# Patient Record
Sex: Female | Born: 1995 | Hispanic: No | Marital: Single | Smoking: Never smoker
Health system: Southern US, Community
[De-identification: ages and names within clinical notes are randomized; demographics above are authoritative.]

## PROBLEM LIST (undated history)

## (undated) DIAGNOSIS — G43909 Migraine, unspecified, not intractable, without status migrainosus: Secondary | ICD-10-CM

## (undated) DIAGNOSIS — I493 Ventricular premature depolarization: Secondary | ICD-10-CM

## (undated) DIAGNOSIS — G40419 Other generalized epilepsy and epileptic syndromes, intractable, without status epilepticus: Secondary | ICD-10-CM

## (undated) HISTORY — PX: TONSILLECTOMY: SUR1361

## (undated) HISTORY — PX: ADENOIDECTOMY: SUR15

## (undated) HISTORY — PX: OTHER SURGICAL HISTORY: SHX169

## (undated) HISTORY — DX: Other generalized epilepsy and epileptic syndromes, intractable, without status epilepticus: G40.419

## (undated) HISTORY — PX: WISDOM TOOTH EXTRACTION: SHX21

---
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
## 2017-01-11 ENCOUNTER — Telehealth: Payer: Self-pay | Admitting: Family Medicine

## 2017-01-11 NOTE — Telephone Encounter (Signed)
Pt need refill in birth control. Pt has appt jan 28. It will run out before then. [facility] [address]

## 2017-01-12 MED ORDER — NORETHINDRONE 0.35 MG PO TABS: 1.0000 | ORAL_TABLET | Freq: Every day | ORAL | 11 refills | Status: DC

## 2017-01-12 NOTE — Telephone Encounter (Signed)
Done

## 2017-01-21 ENCOUNTER — Emergency Department (HOSPITAL_COMMUNITY)
Admission: EM | Admit: 2017-01-21 | Discharge: 2017-01-21 | Disposition: A | Discharge status: Home / Self Care | Payer: Medicaid Other | Attending: Emergency Medicine | Admitting: Emergency Medicine

## 2017-01-21 ENCOUNTER — Encounter (HOSPITAL_COMMUNITY): Payer: Self-pay | Admitting: Nurse Practitioner

## 2017-01-21 DIAGNOSIS — B9689 Other specified bacterial agents as the cause of diseases classified elsewhere: Secondary | ICD-10-CM

## 2017-01-21 DIAGNOSIS — N760 Acute vaginitis: Secondary | ICD-10-CM | POA: Insufficient documentation

## 2017-01-21 DIAGNOSIS — N939 Abnormal uterine and vaginal bleeding, unspecified: Secondary | ICD-10-CM

## 2017-01-21 DIAGNOSIS — N898 Other specified noninflammatory disorders of vagina: Secondary | ICD-10-CM | POA: Diagnosis present

## 2017-01-21 DIAGNOSIS — Z79899 Other long term (current) drug therapy: Secondary | ICD-10-CM | POA: Diagnosis not present

## 2017-01-21 LAB — URINALYSIS, ROUTINE W REFLEX MICROSCOPIC
BILIRUBIN URINE: NEGATIVE
Glucose, UA: NEGATIVE mg/dL
HGB URINE DIPSTICK: NEGATIVE
Ketones, ur: NEGATIVE mg/dL
Leukocytes, UA: NEGATIVE
Nitrite: NEGATIVE
PROTEIN: NEGATIVE mg/dL
Specific Gravity, Urine: 1.021 (ref 1.005–1.030)
pH: 7 (ref 5.0–8.0)

## 2017-01-21 LAB — WET PREP, GENITAL
Sperm: NONE SEEN
TRICH WET PREP: NONE SEEN
Yeast Wet Prep HPF POC: NONE SEEN

## 2017-01-21 LAB — POC URINE PREG, ED: PREG TEST UR: NEGATIVE

## 2017-01-21 MED ORDER — METRONIDAZOLE 500 MG PO TABS: 500.0000 mg | ORAL_TABLET | Freq: Two times a day (BID) | ORAL | 0 refills | Status: DC

## 2017-01-21 NOTE — ED Provider Notes (Signed)
[place] DEPT Provider Note   CSN: [id number] Arrival date & time: 01/21/17  1539     History   Chief Complaint Chief Complaint  Patient presents with  . Vaginal Discharge    HPI Hannah Page is a 21 y.o. female who presents to the ED with vaginal discharge. Patient has been on OC's for 2 years and has not had a period until the past 2 months. Patient reports period is not due until Jan 6th but she has noted spotting. Patient describes the discharge as pink and states it may be related to her period; however, sh reports recent unprotected sex and request evaluation for STD and pregnancy test.   HPI  Past Medical History:  Diagnosis Date  . Migraine   . PVCs (premature ventricular contractions)   . 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 Contraception 02/27/2015  . Right knee pain 01/29/2013  . Ankle pain 02/01/2012  . Tinea versicolor 09/29/2010  . Eczema 04/04/2010  . Acne 04/01/2010  . TENDINITIS, PATELLAR 02/17/2010  . Migraine headache 12/02/2008    Past Surgical History:  Procedure Laterality Date  . ADENOIDECTOMY    . TONSILLECTOMY    . wisdom teeth      OB History    No data available       Home Medications    Prior to Admission medications   Medication Sig Start Date End Date Taking? Authorizing Provider  acetaminophen (TYLENOL) 500 MG tablet Take by mouth.    [provider]  clindamycin-benzoyl peroxide (BENZACLIN WITH PUMP) gel APPLY TOPICALLY 2 (TWO) TIMES DAILY. 07/25/16   Alveda Reasons, MD  famotidine (PEPCID) 20 MG tablet Take 1 tablet (20 mg total) by mouth 2 (two) times daily. 11/11/14   Charlann Lange, PA-C  fluticasone (FLONASE) 50 MCG/ACT nasal spray Place 1 spray into both nostrils daily. 11/08/14   Charlann Lange, PA-C  ibuprofen (ADVIL,MOTRIN) 600 MG tablet Take 1 tablet (600 mg total) by mouth every 8 (eight)  hours as needed. 07/25/16   Alveda Reasons, MD  meloxicam (MOBIC) 15 MG tablet Take one tablet daily for 10 days, then take as needed.  Take with food 02/02/16   Thurman Coyer, DO  norethindrone (MICRONOR,CAMILA,ERRIN) 0.35 MG tablet Take 1 tablet (0.35 mg total) by mouth daily. 01/12/17   Alveda Reasons, MD  promethazine (PHENERGAN) 12.5 MG tablet Take 1 tablet (12.5 mg total) by mouth every 8 (eight) hours as needed for nausea or vomiting. 07/25/16   Alveda Reasons, MD    Family History Family History  Problem Relation Age of Onset  . Arthritis Mother     Social History Social History   Tobacco Use  . Smoking status: Never Smoker  . Smokeless tobacco: Never Used  Substance Use Topics  . Alcohol use: No  . Drug use: No     Allergies   Phenergan [promethazine hcl] and Prednisone   Review of Systems Review of Systems  Constitutional: Negative for chills and fever.  HENT: Negative.   Respiratory: Negative for cough and shortness of breath.   Cardiovascular: Negative for chest pain.  Gastrointestinal: Negative for abdominal pain, nausea and vomiting.  Genitourinary: Positive for vaginal bleeding and vaginal discharge. Negative for dysuria and frequency.  Musculoskeletal: Negative for back pain.  Skin: Negative for rash.  Neurological: Negative for syncope and headaches.  Psychiatric/Behavioral:  Negative for confusion.     Physical Exam Updated Vital Signs BP 139/73 (BP Location: Left Arm)   Pulse 92   Temp 98.5 F (36.9 C) (Oral)   Resp 18   SpO2 95%   Physical Exam  Constitutional: She is oriented to person, place, and time. She appears well-developed and well-nourished. No distress.  HENT:  Head: Normocephalic and atraumatic.  Eyes: EOM are normal.  Neck: Neck supple.  Cardiovascular: Normal rate.  Pulmonary/Chest: Effort normal.  Abdominal: Soft. There is no tenderness.  Genitourinary:  Genitourinary Comments: External genitalia without lesions,  frothy vaginal discharge vaginal vault. No CMT, no adnexal tenderness, uterus is not enlarged.   Musculoskeletal: Normal range of motion.  Neurological: She is alert and oriented to person, place, and time. No cranial nerve deficit.  Skin: Skin is warm and dry.  Psychiatric: She has a normal mood and affect.  Nursing note and vitals reviewed.    ED Treatments / Results  Labs (all labs ordered are listed, but only abnormal results are displayed) Labs Reviewed  WET PREP, GENITAL  URINALYSIS, ROUTINE W REFLEX MICROSCOPIC  RPR  HIV ANTIBODY (ROUTINE TESTING)  POC URINE PREG, ED  GC/CHLAMYDIA PROBE AMP () NOT AT [facility]    Radiology No results found.  Procedures Procedures (including critical care time)  Medications Ordered in ED Medications - No data to display   Initial Impression / Assessment and Plan / ED Course  I have reviewed the triage vital signs and the nursing notes. 21 y.o. female with vaginal d/c and spotting after unprotected sex. Patient awaiting results. Care turned over to [facility]. [facility] @ 5:08 pm.   Final Clinical Impressions(s) / ED Diagnoses   ED Discharge Orders    None       Debroah Baller [place], NP 01/21/17 1709    Dorie Rank, MD 01/21/17 [phone]

## 2017-01-21 NOTE — ED Triage Notes (Signed)
Pt is c/o a pink colored vaginal discharge that she states she suspects that might be related to a menstrual period. She however adds that she recently had period but has hx of irregular periods. Reports a recent hx of unprotected sex and expresses a need for evaluation for and STI. She denies dysuria or pelvic pain.

## 2017-01-21 NOTE — ED Provider Notes (Signed)
5:53 PM BP 139/73 (BP Location: Left Arm)   Pulse 92   Temp 98.5 F (36.9 C) (Oral)   Resp 18   SpO2 95%  Patient taken in sign out from NP [facility]. Patient here for abnormal uterine bleeding.  Wanting STD check.  I reviewed her findings.  She is not pregnant.  She does have bacterial vaginosis.  I discussed the diagnosis with the patient, and let her know that her other lab tests are pending.  She will be discharged with Flagyl.  Warned not to drink alcohol with this medication.  She appears appropriate for discharge at this time   Ned Grace 01/21/17 1754    Dorie Rank, MD 01/22/17 [phone]

## 2017-01-21 NOTE — Discharge Instructions (Signed)
Contact a health care provider if: Your bleeding lasts more than 1 week. You feel dizzy at times. Get help right away if: You pass out. You are changing pads every 15 to 30 minutes. You have abdominal pain. You have a fever. You become sweaty or weak. You are passing large blood clots from the vagina. You start to feel nauseous and vomit.

## 2017-01-22 LAB — HIV ANTIBODY (ROUTINE TESTING W REFLEX): HIV SCREEN 4TH GENERATION: NONREACTIVE

## 2017-01-22 LAB — RPR: RPR Ser Ql: NONREACTIVE

## 2017-01-23 ENCOUNTER — Ambulatory Visit (INDEPENDENT_AMBULATORY_CARE_PROVIDER_SITE_OTHER): Payer: Medicaid Other | Admitting: Sports Medicine

## 2017-01-23 ENCOUNTER — Encounter: Payer: Self-pay | Admitting: Sports Medicine

## 2017-01-23 VITALS — BP 122/65 | Ht 65.0 in | Wt 140.0 lb

## 2017-01-23 DIAGNOSIS — M25562 Pain in left knee: Secondary | ICD-10-CM

## 2017-01-23 DIAGNOSIS — M25561 Pain in right knee: Secondary | ICD-10-CM | POA: Diagnosis present

## 2017-01-23 DIAGNOSIS — M222X1 Patellofemoral disorders, right knee: Secondary | ICD-10-CM | POA: Diagnosis not present

## 2017-01-23 DIAGNOSIS — M222X2 Patellofemoral disorders, left knee: Secondary | ICD-10-CM | POA: Diagnosis not present

## 2017-01-23 LAB — GC/CHLAMYDIA PROBE AMP (~~LOC~~) NOT AT ARMC
Chlamydia: POSITIVE — AB
NEISSERIA GONORRHEA: NEGATIVE

## 2017-01-23 MED ORDER — MELOXICAM 15 MG PO TABS: ORAL_TABLET | ORAL | 0 refills | Status: DC

## 2017-01-23 NOTE — Progress Notes (Signed)
   Subjective:    Patient ID: Hannah Page, female    DOB: [birth year], 21 y.o.   MRN: [id number]  HPI chief complaint: Bilateral knee pain  Patient comes in today with persistent bilateral knee pain. She was last seen in the office a year ago. Her right knee was bothering her at that time. Left knee has recently started as well. She describes an aching discomfort in the anterior knee that is worse with prolonged sitting. 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 She has noticed some feelings of instability in both knees. She wears a compression sleeve on the right knee which is helpful. She denies any locking or catching. No swelling. An MRI done of her more symptomatic right knee in 2016 was unremarkable. She was given a prescription for meloxicam last year but she is unsure whether or not this was helpful. She denies any recent trauma. No nighttime pain.  Interim medical history reviewed Medications reviewed Allergies reviewed    Review of Systems    as above Objective:   Physical Exam  Well-developed, well-nourished. No acute distress. Awake alert and oriented 3. Vital signs reviewed  Examination of both knees show full range of motion. No effusion. She continues to demonstrate a positive J sign. No pain with patellar compression. No patellofemoral crepitus. VMO weakness bilaterally. No tenderness to palpation along medial lateral joint lines. Negative McMurray's. Knee is stable to ligamentous exam. Neurovascularly intact distally.  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 I've given her a prescription with instructions to take it to [facility]. I think she should get a compression sleeve for her left knee in addition to the one that she already has for her right knee. I will refill her meloxicam and she can take 15 mg as needed for pain. I reassured her that her condition is curable but we need to get her quadriceps stronger. I think she is okay to continue with all activities using pain as her guide. She is reassured of her normal MRI findings in 2016. She will follow-up with me as needed.

## 2017-02-20 ENCOUNTER — Ambulatory Visit (INDEPENDENT_AMBULATORY_CARE_PROVIDER_SITE_OTHER): Payer: Medicaid Other | Admitting: Family Medicine

## 2017-02-20 ENCOUNTER — Encounter: Payer: Self-pay | Admitting: Family Medicine

## 2017-02-20 ENCOUNTER — Other Ambulatory Visit: Payer: Self-pay

## 2017-02-20 VITALS — BP 122/80 | HR 68 | Temp 98.3°F | Ht 65.0 in | Wt 145.2 lb

## 2017-02-20 DIAGNOSIS — N6320 Unspecified lump in the left breast, unspecified quadrant: Secondary | ICD-10-CM

## 2017-02-20 DIAGNOSIS — N63 Unspecified lump in unspecified breast: Secondary | ICD-10-CM

## 2017-02-20 DIAGNOSIS — Z23 Encounter for immunization: Secondary | ICD-10-CM | POA: Diagnosis not present

## 2017-02-20 DIAGNOSIS — Z309 Encounter for contraceptive management, unspecified: Secondary | ICD-10-CM | POA: Diagnosis not present

## 2017-02-20 DIAGNOSIS — N6321 Unspecified lump in the left breast, upper outer quadrant: Secondary | ICD-10-CM | POA: Diagnosis not present

## 2017-02-20 NOTE — Progress Notes (Signed)
Subjective:    Hannah Page is a 22 y.o. female who presents to [facility] today for lump in Left breast:  1.  Left breast lump:  Present for over 2 years.  Feels it may have grown "a little" but unsure.  Painful to palpation.  For past month or so she has had some increasing tenderness in both breasts.  Chronic migraines for years, no change in intensity, if anything a little less.  NO drainage from nipples.  No weight loss or night sweats.  No changes in visual fields.    Menses are fairly irregular past few months and with some cramps.  Doesn't want to do anything about this, just mentioned.    Knee pain:  From dancing.  Doing physical therapy.  Improving.     ROS as above per HPI.     The following portions of the patient's history were reviewed and updated as appropriate: allergies, current medications, past medical history, family and social history, and problem list. Patient is a nonsmoker.    PMH reviewed.  Past Medical History:  Diagnosis Date  . Migraine   . PVCs (premature ventricular contractions)   . 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 ADENOIDECTOMY    . TONSILLECTOMY    . wisdom teeth      Medications reviewed. Current Outpatient Medications  Medication Sig Dispense Refill  . acetaminophen (TYLENOL) 500 MG tablet Take by mouth.    . clindamycin-benzoyl peroxide (BENZACLIN WITH PUMP) gel APPLY TOPICALLY 2 (TWO) TIMES DAILY. 50 g 6  . famotidine (PEPCID) 20 MG tablet Take 1 tablet (20 mg total) by mouth 2 (two) times daily. 10 tablet 0  . fluticasone (FLONASE) 50 MCG/ACT nasal spray Place 1 spray into both nostrils daily. 16 g 0  . ibuprofen (ADVIL,MOTRIN) 600 MG tablet Take 1 tablet (600 mg total) by mouth every 8 (eight) hours as needed. 30 tablet 2  . meloxicam (MOBIC) 15 MG tablet Take 1 tablet (15 mg) by mouth with food daily as needed. 30 tablet 0  . metroNIDAZOLE (FLAGYL) 500 MG  tablet Take 1 tablet (500 mg total) by mouth 2 (two) times daily. 14 tablet 0  . norethindrone (MICRONOR,CAMILA,ERRIN) 0.35 MG tablet Take 1 tablet (0.35 mg total) by mouth daily. 1 Package 11  . promethazine (PHENERGAN) 12.5 MG tablet Take 1 tablet (12.5 mg total) by mouth every 8 (eight) hours as needed for nausea or vomiting. 20 tablet 1   No current facility-administered medications for this visit.      Objective:   Physical Exam Ht 5\' 5"  (1.651 m)   Wt 145 lb 3.2 oz (65.9 kg)   BMI 24.16 kg/m  Gen:  Alert, cooperative patient who appears stated age in no acute distress.  Vital signs reviewed. HEENT: EOMI,  MMM Cardiac:  Regular rate and rhythm without murmur auscultated.  Good S1/S2. Pulm:  Clear to auscultation bilaterally with good air movement.  No wheezes or rales noted.   Breast:  2 cm mass, rubbery, somewhat tender in Left breast just anterior to mid-axillary line.  Freely mobile.  No LAD noted left axilla. Right breast completely WNL Abd:  Soft/nondistended/nontender.   Exts: Non edematous BL  LE, warm and well perfused.   Entire exam performed with CMA chaperone present  No results found for this or any previous visit (from the past 72 hour(s)).

## 2017-02-20 NOTE — Patient Instructions (Signed)
It was good to see you again today.  I am going to continue for a repeat ultrasound of a lump on your breast.  Depending on what it shows will determine what we do next.  They may recommend a biopsy, depending on what it shows.  Keep remaining active you have been.  I will see you back in a year or sooner if you have any problems or concerns.

## 2017-02-22 DIAGNOSIS — N63 Unspecified lump in unspecified breast: Secondary | ICD-10-CM | POA: Insufficient documentation

## 2017-02-22 NOTE — Assessment & Plan Note (Signed)
Negative [place] in 2017.  Recommendation at that time was FU in 6 months -- never did.  Will place order for repeat US today.  Likely fibroadenoma.  Breast tenderness likely hormonal.

## 2017-02-22 NOTE — Assessment & Plan Note (Signed)
Progestin-only pills b/c of migraine with aura.

## 2017-02-28 ENCOUNTER — Other Ambulatory Visit: Payer: Medicaid Other

## 2017-03-01 ENCOUNTER — Ambulatory Visit
Admission: RE | Admit: 2017-03-01 | Discharge: 2017-03-01 | Disposition: A | Discharge status: Home / Self Care | Payer: Medicaid Other | Source: Ambulatory Visit | Attending: Family Medicine | Admitting: Family Medicine

## 2017-03-01 DIAGNOSIS — N6320 Unspecified lump in the left breast, unspecified quadrant: Secondary | ICD-10-CM

## 2017-03-06 ENCOUNTER — Other Ambulatory Visit: Payer: Self-pay

## 2017-03-06 ENCOUNTER — Encounter (HOSPITAL_COMMUNITY): Payer: Self-pay

## 2017-03-06 ENCOUNTER — Emergency Department (HOSPITAL_COMMUNITY)
Admission: EM | Admit: 2017-03-06 | Discharge: 2017-03-06 | Disposition: A | Discharge status: Home / Self Care | Payer: Medicaid Other | Attending: Emergency Medicine | Admitting: Emergency Medicine

## 2017-03-06 DIAGNOSIS — Z79899 Other long term (current) drug therapy: Secondary | ICD-10-CM | POA: Insufficient documentation

## 2017-03-06 DIAGNOSIS — Z202 Contact with and (suspected) exposure to infections with a predominantly sexual mode of transmission: Secondary | ICD-10-CM | POA: Diagnosis not present

## 2017-03-06 DIAGNOSIS — A749 Chlamydial infection, unspecified: Secondary | ICD-10-CM | POA: Diagnosis present

## 2017-03-06 MED ORDER — LIDOCAINE HCL (PF) 2 % IJ SOLN
INTRAMUSCULAR | Status: AC
  Administered 2017-03-06: 20:00:00
  Filled 2017-03-06: qty 10

## 2017-03-06 MED ORDER — CEFTRIAXONE SODIUM 250 MG IJ SOLR
250.0000 mg | Freq: Once | INTRAMUSCULAR | Status: AC
  Administered 2017-03-06: 250 mg via INTRAMUSCULAR
  Filled 2017-03-06: qty 250

## 2017-03-06 MED ORDER — AZITHROMYCIN 250 MG PO TABS
1000.0000 mg | ORAL_TABLET | Freq: Once | ORAL | Status: AC
  Administered 2017-03-06: 1000 mg via ORAL
  Filled 2017-03-06: qty 4

## 2017-03-06 NOTE — ED Triage Notes (Addendum)
Patient states she was called back to the ED with a positive chlamydia.

## 2017-03-06 NOTE — ED Notes (Signed)
Bed: [facility]3 Expected date:  Expected time:  Means of arrival:  Comments: 

## 2017-03-06 NOTE — ED Provider Notes (Signed)
[place] DEPT Provider Note   CSN: [id number] Arrival date & time: 03/06/17  1641     History   Chief Complaint Chief Complaint  Patient presents with  . SEXUALLY TRANSMITTED DISEASE    HPI Hannah Page is a 22 y.o. female.  HPI 23 year old female with no pertinent past medical history presents to the emergency department today after being called back for positive chlamydia test.  Patient states that she was tested on 12/29 in the ED.  At that time she was being seen for vaginal discharge.  She was positive for bacterial vaginosis and started on Flagyl.  She was not treated for STD at that time.  States that she received a letter from the public health department today that her tests were positive and that she needed to return to the ED for treatment.  Patient states that after the Flagyl her discharge improved.  Denies any vaginal discharge at this time.  Denies any associated symptoms including abdominal pain, pelvic pain, vaginal bleeding, genital lesions, fevers, nausea or vomiting.  She is sexually active with one female partner does not use protection.  Has informed that the female partner be tested is and treated as well. Past Medical History:  Diagnosis Date  . Migraine   . PVCs (premature ventricular contractions)   . 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 Breast mass 02/22/2017  . Contraception 02/27/2015  . Right knee pain 01/29/2013  . Ankle pain 02/01/2012  . Tinea versicolor 09/29/2010  . Eczema 04/04/2010  . Acne 04/01/2010  . TENDINITIS, PATELLAR 02/17/2010  . Migraine headache 12/02/2008    Past Surgical History:  Procedure Laterality Date  . ADENOIDECTOMY    . TONSILLECTOMY    . wisdom teeth      OB History    No data available       Home Medications    Prior to Admission medications   Medication Sig Start Date End Date Taking?  Authorizing Provider  acetaminophen (TYLENOL) 500 MG tablet Take by mouth.    [provider]  clindamycin-benzoyl peroxide (BENZACLIN WITH PUMP) gel APPLY TOPICALLY 2 (TWO) TIMES DAILY. 07/25/16   Alveda Reasons, MD  famotidine (PEPCID) 20 MG tablet Take 1 tablet (20 mg total) by mouth 2 (two) times daily. 11/11/14   Charlann Lange, PA-C  fluticasone (FLONASE) 50 MCG/ACT nasal spray Place 1 spray into both nostrils daily. 11/08/14   Charlann Lange, PA-C  ibuprofen (ADVIL,MOTRIN) 600 MG tablet Take 1 tablet (600 mg total) by mouth every 8 (eight) hours as needed. 07/25/16   Alveda Reasons, MD  meloxicam (MOBIC) 15 MG tablet Take 1 tablet (15 mg) by mouth with food daily as needed. 01/23/17   Thurman Coyer, DO  metroNIDAZOLE (FLAGYL) 500 MG tablet Take 1 tablet (500 mg total) by mouth 2 (two) times daily. 01/21/17   Margarita Mail, PA-C  norethindrone (MICRONOR,CAMILA,ERRIN) 0.35 MG tablet Take 1 tablet (0.35 mg total) by mouth daily. 01/12/17   Alveda Reasons, MD  promethazine (PHENERGAN) 12.5 MG tablet Take 1 tablet (12.5 mg total) by mouth every 8 (eight) hours as needed for nausea or vomiting. 07/25/16   Alveda Reasons, MD    Family History Family History  Problem Relation Age of Onset  . Arthritis Mother     Social History Social History   Tobacco Use  . Smoking  status: Never Smoker  . Smokeless tobacco: Never Used  Substance Use Topics  . Alcohol use: No  . Drug use: No     Allergies   Phenergan [promethazine hcl] and Prednisone   Review of Systems Review of Systems  Constitutional: Negative for chills and fever.  Gastrointestinal: Negative for abdominal pain, nausea and vomiting.  Genitourinary: Negative.   Skin: Negative for color change.     Physical Exam Updated Vital Signs BP 131/75 (BP Location: Left Arm)   Pulse 63   Temp 98.7 F (37.1 C) (Oral)   Resp 18   Ht 5\' 4"  (1.626 m)   Wt 64.9 kg (143 lb 1.6 oz)   LMP 02/17/2017 (Exact Date)    SpO2 98%   BMI 24.56 kg/m   Physical Exam  Constitutional: She appears well-developed and well-nourished. No distress.  HENT:  Head: Normocephalic and atraumatic.  Eyes: Right eye exhibits no discharge. Left eye exhibits no discharge. No scleral icterus.  Neck: Normal range of motion.  Pulmonary/Chest: No respiratory distress.  Abdominal: Soft. Bowel sounds are normal. She exhibits no distension. There is no tenderness. There is no rebound and no guarding.  Musculoskeletal: Normal range of motion.  Neurological: She is alert.  Skin: Skin is warm and dry. Capillary refill takes less than 2 seconds. No pallor.  Psychiatric: Her behavior is normal. Judgment and thought content normal.  Nursing note and vitals reviewed.    ED Treatments / Results  Labs (all labs ordered are listed, but only abnormal results are displayed) Labs Reviewed - No data to display  EKG  EKG Interpretation None       Radiology No results found.  Procedures Procedures (including critical care time)  Medications Ordered in ED Medications  cefTRIAXone (ROCEPHIN) injection 250 mg (not administered)  azithromycin (ZITHROMAX) tablet 1,000 mg (not administered)     Initial Impression / Assessment and Plan / ED Course  I have reviewed the triage vital signs and the nursing notes.  Pertinent labs & imaging results that were available during my care of the patient were reviewed by me and considered in my medical decision making (see chart for details).     Patient presents to the ED for evaluation after being told that she had a positive chlamydia test.  She was informed by the public health department to return to the ED for treatment.  Patient was tested on 12/29 for vaginal discharge.  Was treated with Flagyl for bacterial vaginosis.  Patient states that her vaginal discharge improved.  Patient was negative for trichomonas.  She had no cervical motion tenderness at that time.  She denies any  vaginal discharge, pelvic pain, abdominal pain at this time.  Denies any urinary symptoms.  Offered pelvic exam and testing today however patient refused.  States that she does want to be treated and discharged.  Given the patient has no symptoms I am agreed with this plan.  She is nontoxic appearing.  We will treated with Rocephin and azithromycin.  Encouraged safe sex practices.  Encouraged follow-up with health department for any worsening symptoms and strict return precautions.  Pt is hemodynamically stable, in NAD, & able to ambulate in the ED. Evaluation does not show pathology that would require ongoing emergent intervention or inpatient treatment. I explained the diagnosis to the patient. Pain has been managed & has no complaints prior to dc. Pt is comfortable with above plan and is stable for discharge at this time. All questions were answered prior to  disposition. Strict return precautions for f/u to the ED were discussed. Encouraged follow up with PCP.   Final Clinical Impressions(s) / ED Diagnoses   Final diagnoses:  STD exposure    ED Discharge Orders    None       Aaron Edelman 03/06/17 Landis Gandy, MD 03/07/17 [phone]

## 2017-03-06 NOTE — Discharge Instructions (Signed)
Have been treated for chlamydia and gonorrhea in the ED.  Perform safe sex practices.  Follow with health department for further testing.  Return the ED with any worsening symptoms.

## 2017-04-03 ENCOUNTER — Other Ambulatory Visit: Payer: Self-pay

## 2017-04-03 MED ORDER — MELOXICAM 15 MG PO TABS: ORAL_TABLET | ORAL | 0 refills | Status: DC

## 2017-04-21 ENCOUNTER — Other Ambulatory Visit: Payer: Self-pay

## 2017-04-21 ENCOUNTER — Telehealth: Payer: Self-pay | Admitting: Family Medicine

## 2017-04-21 ENCOUNTER — Other Ambulatory Visit (HOSPITAL_COMMUNITY)
Admission: RE | Admit: 2017-04-21 | Discharge: 2017-04-21 | Disposition: A | Discharge status: Home / Self Care | Payer: Medicaid Other | Source: Ambulatory Visit | Attending: Family Medicine | Admitting: Family Medicine

## 2017-04-21 ENCOUNTER — Encounter: Payer: Self-pay | Admitting: Family Medicine

## 2017-04-21 ENCOUNTER — Ambulatory Visit (INDEPENDENT_AMBULATORY_CARE_PROVIDER_SITE_OTHER): Payer: Medicaid Other | Admitting: Family Medicine

## 2017-04-21 VITALS — BP 112/70 | HR 87 | Temp 98.5°F | Ht 65.0 in | Wt 140.0 lb

## 2017-04-21 DIAGNOSIS — N898 Other specified noninflammatory disorders of vagina: Secondary | ICD-10-CM

## 2017-04-21 DIAGNOSIS — Z3200 Encounter for pregnancy test, result unknown: Secondary | ICD-10-CM | POA: Diagnosis not present

## 2017-04-21 DIAGNOSIS — Z0000 Encounter for general adult medical examination without abnormal findings: Secondary | ICD-10-CM

## 2017-04-21 DIAGNOSIS — N390 Urinary tract infection, site not specified: Secondary | ICD-10-CM

## 2017-04-21 LAB — POCT URINALYSIS DIP (MANUAL ENTRY)
GLUCOSE UA: NEGATIVE mg/dL
Leukocytes, UA: NEGATIVE
NITRITE UA: NEGATIVE
RBC UA: NEGATIVE
Spec Grav, UA: >=1.03 — AB (ref 1.010–1.025)
Urobilinogen, UA: 1 E.U./dL
pH, UA: 6 (ref 5.0–8.0)

## 2017-04-21 LAB — POCT WET PREP (WET MOUNT)
CLUE CELLS WET PREP WHIFF POC: NEGATIVE
TRICHOMONAS WET PREP HPF POC: ABSENT

## 2017-04-21 LAB — POCT URINE PREGNANCY: Preg Test, Ur: NEGATIVE

## 2017-04-21 MED ORDER — CLINDAMYCIN PHOS-BENZOYL PEROX 1-5 % EX GEL: CUTANEOUS | 6 refills | Status: DC

## 2017-04-21 MED ORDER — FLUCONAZOLE 150 MG PO TABS: 150.0000 mg | ORAL_TABLET | Freq: Once | ORAL | 0 refills | Status: AC

## 2017-04-21 NOTE — Telephone Encounter (Signed)
Called and discussed wet prep with patient.  I do think that her physiologic discharge was likely "hiding" her yeast.  Did seem very much like yeast on external genitalia exam today.  Plan is to have her go and finish the Diflucan.  I will call her Monday with her STD results.  I also told her to come back in the next 2 weeks or so for recheck of her urinalysis because of the elevated bilirubin.  Most likely this is secondary to dehydration with a high specific gravity.  She expressed understanding and will follow-up

## 2017-04-21 NOTE — Progress Notes (Addendum)
Hannah Page is a 22 y.o. female who presents to [facility] today for comprehensive physical examination:  CPE  Concerns: Recently tested positive for chlamydia.  She would like to be rechecked and wants to ensure that the health department does not send information to her home this and her mother would find out.  Evidently this happened when she was diagnosed with chlamydia in the emergency department.  She did receive treatment as did her partner.  This will be a test of cure.  She has had no new sexual partners.  She also was tested for blood-borne STDs at that visit.  Has not had a period since February 20.  She is on birth control.  Even on birth control her periods have been abnormal.  She would like to be pregnancy test today.  She has had some vaginal discharge.  Last physical last year Tetanus up to date Flu vaccine up to date  Eye exam:  n/a Dental exam every six months.   PMH reviewed. Patient is a nonsmoker.   Past Medical History:  Diagnosis Date  . Migraine   . PVCs (premature ventricular contractions)   . 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 ADENOIDECTOMY    . TONSILLECTOMY    . wisdom teeth      Medications reviewed. Current Outpatient Medications  Medication Sig Dispense Refill  . acetaminophen (TYLENOL) 500 MG tablet Take by mouth.    . clindamycin-benzoyl peroxide (BENZACLIN WITH PUMP) gel APPLY TOPICALLY 2 (TWO) TIMES DAILY. 50 g 6  . famotidine (PEPCID) 20 MG tablet Take 1 tablet (20 mg total) by mouth 2 (two) times daily. 10 tablet 0  . fluconazole (DIFLUCAN) 150 MG tablet Take 1 tablet (150 mg total) by mouth once for 1 dose. 2 tablet 0  . fluticasone (FLONASE) 50 MCG/ACT nasal spray Place 1 spray into both nostrils daily. 16 g 0  . ibuprofen (ADVIL,MOTRIN) 600 MG tablet Take 1 tablet (600 mg total) by mouth every 8 (eight) hours as needed. 30 tablet 2   . meloxicam (MOBIC) 15 MG tablet Take 1 tablet (15 mg) by mouth with food daily as needed. 30 tablet 0  . norethindrone (MICRONOR,CAMILA,ERRIN) 0.35 MG tablet Take 1 tablet (0.35 mg total) by mouth daily. 1 Package 11  . promethazine (PHENERGAN) 12.5 MG tablet Take 1 tablet (12.5 mg total) by mouth every 8 (eight) hours as needed for nausea or vomiting. 20 tablet 1   No current facility-administered medications for this visit.     Social: Smoking history:  denies Alcohol use:  irregular Illicit drug use:  denies Relationship status:   Same boyfriend Occupation:  Ship broker in college  Family History:  Mother with frequent BV infections and mental health disorder (mostly depression)  Review of Systems  Constitutional: Negative for fever.  HENT: Negative for congestion, ear discharge, ear pain and hearing loss.   Eyes: Negative for blurred vision.  Respiratory: Negative for cough and wheezing.   Cardiovascular: Negative for chest pain, palpitations and leg swelling.  Gastrointestinal: Negative for nausea, vomiting and abdominal pain.  Genitourinary: Negative for dysuria, hematuria and flank pain.  Musculoskeletal: Negative for neck pain.  Skin: Negative for rash.  Neurological: Negative for dizziness and headaches.  Psychiatric/Behavioral: Negative for depression and suicidal ideas.   Exam: BP 112/70   Pulse 87   Temp 98.5 F (36.9 C) (Oral)  Ht 5\' 5"  (1.651 m)   Wt 140 lb (63.5 kg)   LMP 03/17/2017 (Exact Date)   SpO2 99%   BMI 23.30 kg/m  Gen:  Alert, cooperative patient who appears stated age in no acute distress.  Vital signs reviewed. Head: Onyx/AT.   Eyes:  EOMI, PERRL.   Ears:  External ears WNL, Bilateral TM's normal without retraction, redness or bulging. Nose:  Septum midline  Mouth:  MMM, tonsils non-erythematous, non-edematous.   Neck: No masses or thyromegaly or limitation in range of motion.  No cervical lymphadenopathy. Pulm:  Clear to auscultation  bilaterally with good air movement.  No wheezes or rales noted.   Cardiac:  Regular rate and rhythm without murmur auscultated.  Good S1/S2. Abd:  Soft/nondistended/nontender.  Good bowel sounds throughout all four quadrants.  No masses noted.  Ext:  No clubbing/cyanosis/erythema.  No edema noted bilateral lower extremities. GYN:  External genitalia within normal limits, though does have curdish white discharge like yeast.  Vaginal mucosa pink, moist, normal rugae.  Nonfriable cervix without lesions.  Thick white DC on exam without bleeding.  Obtained wet prep and GC/Chlamydia testing.  Neuro:  Grossly normal, no gait abnormalities Psych:  Not depressed or anxious appearing.  Conversant and engaged Skin:  No jaundice  GYN exam done with CMA chaperone in room entire time.   Impression/Plan: 1. Complete Physical Examination: anticipatory guidance provided.  2.  STD screening/high risk sexual behavior: counseling provided; obtain GC/Chlam.  Will treat here if positive.  Wet prep obtained today. - Pregnancy test and U/A negative here.  - wet prep today looked like yeast exterior genitalia.  Large amount of discharge, which might hide yeast on the wet prep.  Treat with diflucan. - Await STD testing.  Call patient on Monday.  - Hasn't had anything to drink or eat today due to labs.  UA with bilirubin and high spec grav.  Repeat once symptoms end to re-evaluate, though likely from dehydration.  No jaundice or icterus on exam, so not likely to be liver disease or hemolysis contributing to elevated bilirubin.

## 2017-04-21 NOTE — Patient Instructions (Signed)
It was good to see you again today!  I'll call and let you know the results this afternoon and Monday.  If you have any other questions or concerns, let me know.

## 2017-04-24 LAB — CERVICOVAGINAL ANCILLARY ONLY
Chlamydia: NEGATIVE
Neisseria Gonorrhea: NEGATIVE

## 2017-04-25 ENCOUNTER — Telehealth: Payer: Self-pay | Admitting: Family Medicine

## 2017-04-25 DIAGNOSIS — E860 Dehydration: Secondary | ICD-10-CM

## 2017-04-25 NOTE — Telephone Encounter (Signed)
Called and spoke with patient regarding negative STD testing.  She was relieved.  With FU in ~1 month to recheck UA to ensure bilirubin resolved.

## 2017-06-22 ENCOUNTER — Ambulatory Visit
Admission: RE | Admit: 2017-06-22 | Discharge: 2017-06-22 | Disposition: A | Discharge status: Home / Self Care | Payer: Medicaid Other | Source: Ambulatory Visit | Attending: Sports Medicine | Admitting: Sports Medicine

## 2017-06-22 ENCOUNTER — Ambulatory Visit (INDEPENDENT_AMBULATORY_CARE_PROVIDER_SITE_OTHER): Payer: Medicaid Other | Admitting: Sports Medicine

## 2017-06-22 VITALS — BP 120/70 | Ht 65.0 in | Wt 140.0 lb

## 2017-06-22 DIAGNOSIS — M25562 Pain in left knee: Principal | ICD-10-CM

## 2017-06-22 DIAGNOSIS — G8929 Other chronic pain: Secondary | ICD-10-CM | POA: Diagnosis not present

## 2017-06-22 NOTE — Progress Notes (Addendum)
   Subjective:    Patient ID: Hannah Page, female    DOB: 17-Jun-1995, 22 y.o.   MRN: [id number]  HPI chief complaint: Left knee pain  Patient comes in today with persistent left knee pain.She was last seen in the office in February. She was referred to physical therapy and after several weeks of formal PT she was discharged to a home exercise program. While in formal PT, her knee pain improved in both the right and left knees. However, when she transitioned to a home exercise program, her left knee pain began to return. Her pain is primarily in the posterior aspect of the knee. She notices it most with going up and down stairs but also has pain with dance as well as with prolonged walking. She has noticed difficulty in completely extending the knee at times. She has not noticed any swelling. No recent trauma. She does endorse some feelings of instability in the left knee when dancing. She has tried a knee sleeve without improvement. Her right knee pain has resolved.   Review of Systems As above    Objective:   Physical Exam  Well-developed, well-nourished. No acute distress. Awake alert and oriented 3. Vital signs reviewed  Left knee:Full range of motion. No effusion. Good strength. She is tender to palpation along the lateral joint line and has pain with Thessaly's. No tenderness along the medial joint line. Negative McMurray's. Knee is stable to valgus and varus stressing.Negative anterior drawer, negative posterior drawer. Neurovascularly intact distally. Walking without a limp.  X-rays of the left knee including AP, lateral, and sunrise views are unremarkable      Assessment & Plan:   Persistent left knee pain likely secondary to patellofemoral pain syndrome-rule out meniscal tear versus loose body  Given the patient's history of intermittent mechanical symptoms along with her normal x-ray and failure to improve with several weeks of formal physical therapy I think that it is  reasonable to get further diagnostic imaging in the form of an MRI specifically to rule out a meniscal tear or a possible small loose body. Phone follow-up with those results when available. We will delineate a more definitive treatment plan based on those findings. In the meantime, I've provided her with a double upright knee brace to wear when active.

## 2017-06-27 ENCOUNTER — Ambulatory Visit
Admission: RE | Admit: 2017-06-27 | Discharge: 2017-06-27 | Disposition: A | Discharge status: Home / Self Care | Payer: Medicaid Other | Source: Ambulatory Visit | Attending: Sports Medicine | Admitting: Sports Medicine

## 2017-06-27 DIAGNOSIS — M25562 Pain in left knee: Principal | ICD-10-CM

## 2017-06-27 DIAGNOSIS — G8929 Other chronic pain: Secondary | ICD-10-CM

## 2017-06-30 ENCOUNTER — Other Ambulatory Visit: Payer: Self-pay

## 2017-06-30 ENCOUNTER — Telehealth: Payer: Self-pay | Admitting: Sports Medicine

## 2017-06-30 DIAGNOSIS — G8929 Other chronic pain: Secondary | ICD-10-CM

## 2017-06-30 DIAGNOSIS — M25562 Pain in left knee: Principal | ICD-10-CM

## 2017-06-30 NOTE — Telephone Encounter (Signed)
I spoke with the patient on the phone today after reviewing the MRI of her left knee. It is unremarkable. Since she has had good results with formal physical therapy in the past, I recommend that she return for some more PT. She is in college at [employer] but she is home for the summer so I will [facility]. She may wean to a home exercise program per the therapist's discretion and follow-up with me as needed.

## 2017-07-25 ENCOUNTER — Encounter: Payer: Self-pay | Admitting: Physical Therapy

## 2017-07-25 ENCOUNTER — Ambulatory Visit: Payer: Medicaid Other | Attending: Sports Medicine | Admitting: Physical Therapy

## 2017-07-25 DIAGNOSIS — M6281 Muscle weakness (generalized): Secondary | ICD-10-CM | POA: Insufficient documentation

## 2017-07-25 DIAGNOSIS — M25562 Pain in left knee: Secondary | ICD-10-CM | POA: Insufficient documentation

## 2017-07-25 DIAGNOSIS — G8929 Other chronic pain: Secondary | ICD-10-CM | POA: Insufficient documentation

## 2017-07-25 NOTE — Patient Instructions (Signed)
Access Code: [facility]  URL: [email or web address]  Date: 07/25/2017  Prepared by: Raeford Razor   Exercises  Clamshell - 10 reps - 2 sets - 5 hold - 1x daily - 7x weekly  Supine Active Straight Leg Raise - 10 reps - 2 sets - 5 hold - 1x daily - 7x weekly  Sidelying Hip Abduction - 10 reps - 2 sets - 5 hold - 1x daily - 7x weekly  Supine Hamstring Stretch - 3 reps - 1 sets - 30 hold - 1x daily - 7x weekly  Supine ITB Stretch with Strap - 3 reps - 1 sets - 30 hold - 1x daily - 7x weekly  Hip Adductors and Hamstring Stretch with Strap - 3 reps - 1 sets - 30 hold - 1x daily - 7x weekly

## 2017-07-25 NOTE — Therapy (Signed)
[place], [place], [address] Phone: [phone]   Fax:  [phone]  Physical Therapy Evaluation  Patient Details  Name: Hannah Page MRN: [id number] Date of Birth: 1995-10-31 Referring Provider: Dr. Shellia Cleverly   Encounter Date: 07/25/2017  PT End of Session - 07/25/17 1139    Visit Number  1    Number of Visits  12    Date for PT Re-Evaluation  09/19/17    PT Start Time  1015    PT Stop Time  1100    PT Time Calculation (min)  45 min    Activity Tolerance  Patient tolerated treatment well    Behavior During Therapy  [facility] for tasks assessed/performed       Past Medical History:  Diagnosis Date  . Migraine   . PVCs (premature ventricular contractions)   . 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 ADENOIDECTOMY    . TONSILLECTOMY    . wisdom teeth      There were no vitals filed for this visit.   Subjective Assessment - 07/25/17 1018    Subjective  Pt reports chronic knee pain L side as a young lady.  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  She has difficulty going up and down stairs, hills, extending knee after sitting a period.  Pain if she steps wrong.  She wears a hinged brace which helps.  PT prior in [place] helped but pain returned afterwards    Pertinent History  --    Limitations  Sitting;Lifting;Standing;Walking limited on uneven ground     How long can you sit comfortably?  30 min     How long can you stand comfortably?  not limted     How long can you walk comfortably?  not limited     Diagnostic tests  XR normal , MRI normal     Patient Stated Goals  Pt would like to have less pain.      Currently in Pain?  Yes    Pain Score  2  sometimes can be 8/10 during and after     Pain Location  Knee    Pain Orientation  Left;Posterior    Pain  Descriptors / Indicators  Tightness    Pain Type  Chronic pain    Pain Onset  More than a month ago    Pain Frequency  Intermittent    Aggravating Factors   squatting, jumping, hills    Pain Relieving Factors  brace, ice, heat     Effect of Pain on Daily Activities  may limit her ability to do her workouts and fitness          [facility] PT Assessment - 07/25/17 0001      Assessment   Medical Diagnosis  L knee pain     Referring Provider  Dr. Shellia Cleverly    Onset Date/Surgical Date  -- chronic     Prior Therapy  Yes      Precautions   Precautions  None    Required Braces or Orthoses  Other Brace/Splint    Other Brace/Splint  hinged and sleeve       Restrictions   Weight Bearing Restrictions  No      Balance Screen   Has the patient fallen in the past 6  months  No      Home Film/video editor residence    Living Arrangements  Parent    Type of [place] Access  Level entry    [place]  One level    Additional Comments  lives on campus when at school       Prior Function   Level of Independence  Independent    Vocation  Student    Leisure  dance, physical activity , sleep       Cognition   Overall Cognitive Status  Within Functional Limits for tasks assessed      Observation/Other Assessments   Focus on Therapeutic Outcomes (FOTO)   NT MCD      Sensation   Light Touch  Appears Intact      Squat   Comments  no pain       Lunges   Comments  WNL       Step Up   Comments  pain in both phases       Step Down   Comments  min pain, adducts       Single Leg Stance   Comments  WNL min pain, cues to unlock knee       Posture/Postural Control   Posture/Postural Control  Postural limitations    Postural Limitations  Rounded Shoulders;Forward head;Increased lumbar lordosis;Anterior pelvic tilt    Posture Comments  genu recurvatum, ,mild       AROM   Overall AROM Comments  full ROM, no pain       PROM   Overall PROM  Comments  hyperextends 10 deg in standing       Strength   Right Hip Flexion  4+/5    Right Hip Extension  5/5    Right Hip ABduction  5/5    Right Hip ADduction  4/5    Left Hip Flexion  4/5    Left Hip Extension  5/5    Left Hip ABduction  4+/5    Left Hip ADduction  3+/5    Right Knee Flexion  5/5    Right Knee Extension  5/5    Left Knee Flexion  5/5    Left Knee Extension  4/5 pain       Flexibility   Hamstrings  90/90 test: Rt 20, Lt. 24     Quadriceps  WNL     ITB  tighter L side       Palpation   Patella mobility  WNL     Palpation comment  non tender , no crepitus       Special Tests    Special Tests  Knee Special Tests;Meniscus Tests    Knee Special tests   McConnell Test;Patellofemoral Grind Test (Clarke's Sign)    Meniscus Tests  Apley's Compression      McConnell Test   Findings  Negative      Patellofemoral Grind test (Clark's Sign)   Findings  Negative      Apley's Compression   Findings  Negative      Ambulation/Gait   Gait Comments  walk without a limp                 Objective measurements completed on examination: See above findings.              PT Education - 07/25/17 1137    Education Details  PT/POC, HEP, role of hips in knee pain,  pelvic stabilty    Person(s) Educated  Patient    Methods  Explanation;Handout;Verbal cues    Comprehension  Verbalized understanding;Returned demonstration          PT Long Term Goals - 07/25/17 1140      PT LONG TERM GOAL #1   Title  Pt will be I with HEP for core, hips, knee     Baseline  given on eval     Time  6    Period  Weeks    Status  New    Target Date  09/12/17      PT LONG TERM GOAL #2   Title  Pt will be able to jog, complete normal practice routine with no increase in pain     Baseline  pain can be as high as 7/10 with her workouts     Time  6    Period  Weeks    Status  New    Target Date  09/12/17      PT LONG TERM GOAL #3   Title  Pt will demo 5/5  strength in quads, hip add, abduction to maximize pelvic stability needed for sport.     Baseline  4/5 knee ext and lateral hip     Time  6    Period  Weeks    Status  New    Target Date  09/12/17      PT LONG TERM GOAL #4   Title  Pt will perform steps without increasing pain, good eccentric control.     Baseline  painful , weak on descent     Time  6    Period  Weeks    Status  New    Target Date  09/12/17      PT LONG TERM GOAL #5   Title  Pt will be able to sit for >1 hour and report min noticeable stiffness in knee when standing     Baseline  limited to 20-30 min     Time  6    Period  Weeks    Status  New    Target Date  09/12/17             Plan - 07/25/17 1326    Clinical Impression Statement  Patient presents with low complexity eval of L knee which has been ongoing for many yrs. She has noticed an increase when she began to be more active in her dance activities.  She has normal ligamentous testing, normal MRI.  Strength is good but does have less strength in L VMO, hip adductors and abductors which may be contributing to her pain.  She was advsed to continue wearing her brace, and perform HEP with diligence.  She should do well as PT has helped in the past.     Clinical Presentation  Stable    Clinical Decision Making  Low    Rehab Potential  Excellent    PT Frequency  2x / week    PT Duration  6 weeks    PT Treatment/Interventions  ADLs/Self Care Home Management;Electrical Stimulation;Functional mobility training;Neuromuscular re-education;Taping;Therapeutic activities;Therapeutic exercise;Patient/family education;Manual techniques;Ultrasound;Cryotherapy;Iontophoresis 4mg /ml Dexamethasone    PT Next Visit Plan  check HEP, tape for quads vs Mcconnell     PT Home Exercise Plan  3 way hip stretch, clam hip abd and SLR with VMO     Consulted and Agree with Plan of Care  Patient       Patient will benefit from skilled  therapeutic intervention in order to improve  the following deficits and impairments:  Decreased mobility, Decreased range of motion, Decreased strength, Postural dysfunction, Pain  Visit Diagnosis: Chronic pain of left knee  Muscle weakness (generalized)     Problem List Patient Active Problem List   Diagnosis Date Noted  . Breast mass 02/22/2017  . Contraception 02/27/2015  . Right knee pain 01/29/2013  . Ankle pain 02/01/2012  . Tinea versicolor 09/29/2010  . Eczema 04/04/2010  . Acne 04/01/2010  . TENDINITIS, PATELLAR 02/17/2010  . Migraine headache 12/02/2008    PAA,JENNIFER 07/25/2017, 5:46 PM  [facility] [address] [place], [place], [address] Phone: [phone]   Fax:  [phone]  Name: Hannah Page MRN: [id number] Date of Birth: 13-Mar-1995   Raeford Razor, PT 07/25/17 5:47 PM Phone: [phone] Fax: [phone]

## 2017-08-08 ENCOUNTER — Ambulatory Visit: Payer: Medicaid Other | Admitting: Physical Therapy

## 2017-08-08 ENCOUNTER — Encounter: Payer: Self-pay | Admitting: Physical Therapy

## 2017-08-08 DIAGNOSIS — G8929 Other chronic pain: Secondary | ICD-10-CM | POA: Diagnosis not present

## 2017-08-08 DIAGNOSIS — M6281 Muscle weakness (generalized): Secondary | ICD-10-CM

## 2017-08-08 DIAGNOSIS — M25562 Pain in left knee: Secondary | ICD-10-CM | POA: Diagnosis not present

## 2017-08-08 NOTE — Patient Instructions (Signed)
JOSPT hip exercises added to HEP 1st 3 Every other day 10-20 x each 0 second holds

## 2017-08-08 NOTE — Therapy (Signed)
[place], [place], [address] Phone: [phone]   Fax:  [phone]  Physical Therapy Treatment  Patient Details  Name: Hannah Page MRN: [id number] Date of Birth: Dec 09, 1995 Referring Provider: Dr. Shellia Cleverly   Encounter Date: 08/08/2017  PT End of Session - 08/08/17 1751    Visit Number  2    Number of Visits  12    Date for PT Re-Evaluation  09/19/17    PT Start Time  1332    PT Stop Time  1416    PT Time Calculation (min)  44 min    Activity Tolerance  Patient tolerated treatment well    Behavior During Therapy  [facility] for tasks assessed/performed       Past Medical History:  Diagnosis Date  . Migraine   . PVCs (premature ventricular contractions)   . 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 ADENOIDECTOMY    . TONSILLECTOMY    . wisdom teeth      There were no vitals filed for this visit.  Subjective Assessment - 08/08/17 1336    Subjective  Has has a painful week .  Pain intermittant .    Did exercises when her knee was not hurting.  had some pain lef hip anterior in August last year  it resolved itself  (Dancing)    Currently in Pain?  Yes    Pain Score  0-No pain    Pain Location  Knee    Pain Orientation  Left;Posterior    Pain Descriptors / Indicators  -- Jab,  pulling    Pain Type  Chronic pain    Aggravating Factors   squatting longer,  jumping,  hills steps    Pain Relieving Factors  ice,,  heat,  brace    Effect of Pain on Daily Activities  limits dancing  workouts.     Multiple Pain Sites  No                       [facility] Adult PT Treatment/Exercise - 08/08/17 0001      Self-Care   Self-Care  Other Self-Care Comments    Other Self-Care Comments   how to get in and out of car without pivot twist      Knee/Hip Exercises: Stretches   Active Hamstring Stretch  1 rep;30  seconds no pain    ITB Stretch  1 rep    ITB Stretch Limitations  cued to avoid pain with knee slightly flexed.     Gastroc Stretch  3 reps;30 seconds    Gastroc Stretch Limitations  incline,  keeps knee slightly flexed      Knee/Hip Exercises: Supine   Straight Leg Raises  2 sets;10 reps      Knee/Hip Exercises: Sidelying   Hip ABduction  10 reps;2 sets    Clams  10 reps 2 sets      Manual Therapy   Manual Therapy  Taping    Kinesiotex  Facilitate Muscle      Kinesiotix   Facilitate Muscle   quads and hamstrings             PT Education - 08/08/17 1445    Education Details  HEP,  anatomy,  how tape Works.    Person(s) Educated  Patient    Methods  Explanation;Demonstration;Tactile cues;Verbal cues;Handout  Comprehension  Verbalized understanding;Returned demonstration          PT Long Term Goals - 07/25/17 1140      PT LONG TERM GOAL #1   Title  Pt will be I with HEP for core, hips, knee     Baseline  given on eval     Time  6    Period  Weeks    Status  New    Target Date  09/12/17      PT LONG TERM GOAL #2   Title  Pt will be able to jog, complete normal practice routine with no increase in pain     Baseline  pain can be as high as 7/10 with her workouts     Time  6    Period  Weeks    Status  New    Target Date  09/12/17      PT LONG TERM GOAL #3   Title  Pt will demo 5/5 strength in quads, hip add, abduction to maximize pelvic stability needed for sport.     Baseline  4/5 knee ext and lateral hip     Time  6    Period  Weeks    Status  New    Target Date  09/12/17      PT LONG TERM GOAL #4   Title  Pt will perform steps without increasing pain, good eccentric control.     Baseline  painful , weak on descent     Time  6    Period  Weeks    Status  New    Target Date  09/12/17      PT LONG TERM GOAL #5   Title  Pt will be able to sit for >1 hour and report min noticeable stiffness in knee when standing     Baseline  limited to 20-30 min      Time  6    Period  Weeks    Status  New    Target Date  09/12/17            Plan - 08/08/17 1751    Clinical Impression Statement  Exercise and taping focus of session.  Patient was having pain with HEP IT band stretching.  Pain decreased when cued to keep knee from hyperextending.  Mild,  brief increases of pain noted with exercise      PT Next Visit Plan  check HEP, check tape for quads vs Mcconnell .  manual hamstrings,  issue rest of JOSPT Hip exercises. Check for calf tightness    PT Home Exercise Plan  3 way hip stretch, clam hip abd and SLR with VMO Single leg bridge, minisquat with side step,  band,  clams with band    Consulted and Agree with Plan of Care  Patient       Patient will benefit from skilled therapeutic intervention in order to improve the following deficits and impairments:     Visit Diagnosis: Chronic pain of left knee  Muscle weakness (generalized)     Problem List Patient Active Problem List   Diagnosis Date Noted  . Breast mass 02/22/2017  . Contraception 02/27/2015  . Right knee pain 01/29/2013  . Ankle pain 02/01/2012  . Tinea versicolor 09/29/2010  . Eczema 04/04/2010  . Acne 04/01/2010  . TENDINITIS, PATELLAR 02/17/2010  . Migraine headache 12/02/2008    Chevette Fee PTA 08/08/2017, 5:56 PM  [facility] [place],  [place], [address] Phone: [phone]   Fax:  [phone]  Name: EMOREE SASAKI MRN: [id number] Date of Birth: 21-Jan-1996

## 2017-08-09 ENCOUNTER — Ambulatory Visit: Payer: Medicaid Other | Admitting: Physical Therapy

## 2017-08-09 ENCOUNTER — Encounter: Payer: Self-pay | Admitting: Physical Therapy

## 2017-08-09 DIAGNOSIS — G8929 Other chronic pain: Secondary | ICD-10-CM

## 2017-08-09 DIAGNOSIS — M25562 Pain in left knee: Secondary | ICD-10-CM | POA: Diagnosis not present

## 2017-08-09 DIAGNOSIS — M6281 Muscle weakness (generalized): Secondary | ICD-10-CM

## 2017-08-09 NOTE — Patient Instructions (Signed)
Issued from exercise drawer: Hip extension with knee flexed and extended Every other day 10 - 20 x each  1 second hold

## 2017-08-09 NOTE — Therapy (Signed)
[facility] [place], [place], [address] Phone: [phone]   Fax:  [phone]  Physical Therapy Treatment  Patient Details  Name: Hannah Page MRN: [id number] Date of Birth: [birth year] Referring Provider: Dr. Shellia Cleverly   Encounter Date: 08/09/2017  PT End of Session - 08/09/17 1121    Visit Number  3    Number of Visits  12    Date for PT Re-Evaluation  09/19/17    PT Start Time  1016    PT Stop Time  1105    PT Time Calculation (min)  49 min    Activity Tolerance  Patient tolerated treatment well    Behavior During Therapy  [facility] for tasks assessed/performed       Past Medical History:  Diagnosis Date  . Migraine   . PVCs (premature ventricular contractions)   . 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 ADENOIDECTOMY    . TONSILLECTOMY    . wisdom teeth      There were no vitals filed for this visit.  Subjective Assessment - 08/09/17 1023    Subjective  No pain now had up to 5/10 ,, yesterday,  It lasted 30 minutes.    Currently in Pain?  No/denies    Pain Location  Knee    Pain Orientation  Left                       [facility] Adult PT Treatment/Exercise - 08/09/17 0001      Self-Care   Self-Care  Heat/Ice Application      Lumbar Exercises: Quadruped   Other Quadruped Lumbar Exercises  on elbows and knees:  10 x each  hip extension knee straight and knee flexed mod cues for isolation of hip from low back  HEP      Knee/Hip Exercises: Stretches   Gastroc Stretch  3 reps;30 seconds incline board anle to place whole foot on incline      Knee/Hip Exercises: Machines for Strengthening   Cybex Knee Flexion  20 LBS both 2 sets of 10.  Mild increase soreness posterior knee at end of last set    Total Gym Leg Press  40 LBS 10 X 2 sets,  mild increase pain posterior knee at end of 2nd set,  @0  Lbs both  too easy.       Knee/Hip Exercises: Standing   Functional Squat  5 sets with green band ,  feet forward.  sides right and left.      Knee/Hip Exercises: Supine   Short Arc Quad Sets  5 reps pain posterior knee left 0 LBS so stopped.     Patellar Mobs  checked.  Non tight      Knee/Hip Exercises: Sidelying   Clams  20 with green band      Cryotherapy   Number Minutes Cryotherapy  10 Minutes    Cryotherapy Location  Knee    Type of Cryotherapy  -- cold pack      Manual Therapy   Manual Therapy  Soft tissue mobilization    Soft tissue mobilization  hamstrings with soft pool noodle to left hamstrings  medial >lateral.       Kinesiotix   Facilitate Muscle   patched curled tape distal knee             PT Education -  08/09/17 1120    Education Details  HEP,  self care    Person(s) Educated  Patient    Methods  Explanation;Tactile cues;Verbal cues;Handout    Comprehension  Verbalized understanding;Returned demonstration          PT Long Term Goals - 07/25/17 1140      PT LONG TERM GOAL #1   Title  Pt will be I with HEP for core, hips, knee     Baseline  given on eval     Time  6    Period  Weeks    Status  New    Target Date  09/12/17      PT LONG TERM GOAL #2   Title  Pt will be able to jog, complete normal practice routine with no increase in pain     Baseline  pain can be as high as 7/10 with her workouts     Time  6    Period  Weeks    Status  New    Target Date  09/12/17      PT LONG TERM GOAL #3   Title  Pt will demo 5/5 strength in quads, hip add, abduction to maximize pelvic stability needed for sport.     Baseline  4/5 knee ext and lateral hip     Time  6    Period  Weeks    Status  New    Target Date  09/12/17      PT LONG TERM GOAL #4   Title  Pt will perform steps without increasing pain, good eccentric control.     Baseline  painful , weak on descent     Time  6    Period  Weeks    Status  New    Target Date  09/12/17      PT LONG TERM  GOAL #5   Title  Pt will be able to sit for >1 hour and report min noticeable stiffness in knee when standing     Baseline  limited to 20-30 min     Time  6    Period  Weeks    Status  New    Target Date  09/12/17            Plan - 08/09/17 1121    Clinical Impression Statement  patient was able to progress HEP today without increased pain.  Mild increased pain noted with strengthening today eased when exercise stopped.  Encouraged patient to let me know when pain increases.     PT Next Visit Plan  continue hip strengthening.  retape as needed.  Consider step ups and wall sits     PT Home Exercise Plan  3 way hip stretch, clam hip abd and SLR with VMO Single leg bridge, minisquat with side step,  band,  clams with band,  quadriped from elbows:  hip extension with knee flexed and straight    Consulted and Agree with Plan of Care  Patient       Patient will benefit from skilled therapeutic intervention in order to improve the following deficits and impairments:     Visit Diagnosis: Chronic pain of left knee  Muscle weakness (generalized)     Problem List Patient Active Problem List   Diagnosis Date Noted  . Breast mass 02/22/2017  . Contraception 02/27/2015  . Right knee pain 01/29/2013  . Ankle pain 02/01/2012  . Tinea versicolor 09/29/2010  . Eczema 04/04/2010  . Acne 04/01/2010  .  TENDINITIS, PATELLAR 02/17/2010  . Migraine headache 12/02/2008    Hagen Tidd PTA 08/09/2017, 12:01 PM  [facility] [address] [place], [place], [address] Phone: [phone]   Fax:  [phone]  Name: Hannah Page MRN: [id number] Date of Birth: 1995-02-20

## 2017-08-14 ENCOUNTER — Encounter: Payer: Medicaid Other | Admitting: Physical Therapy

## 2017-08-15 ENCOUNTER — Other Ambulatory Visit: Payer: Self-pay | Admitting: Family Medicine

## 2017-08-15 ENCOUNTER — Ambulatory Visit: Payer: Medicaid Other | Admitting: Physical Therapy

## 2017-08-15 DIAGNOSIS — M6281 Muscle weakness (generalized): Secondary | ICD-10-CM

## 2017-08-15 DIAGNOSIS — M25562 Pain in left knee: Secondary | ICD-10-CM | POA: Diagnosis not present

## 2017-08-15 DIAGNOSIS — G8929 Other chronic pain: Secondary | ICD-10-CM | POA: Diagnosis not present

## 2017-08-15 NOTE — Therapy (Signed)
[place], [place], [address] Phone: [phone]   Fax:  [phone]  Physical Therapy Treatment  Patient Details  Name: Hannah Page MRN: [id number] Date of Birth: 09-15-1995 Referring Provider: Dr. Shellia Cleverly   Encounter Date: 08/15/2017  PT End of Session - 08/15/17 1725    Visit Number  4    Number of Visits  12    Date for PT Re-Evaluation  09/19/17    PT Start Time  1632    PT Stop Time  1713    PT Time Calculation (min)  41 min    Activity Tolerance  Patient tolerated treatment well    Behavior During Therapy  [facility] for tasks assessed/performed       Past Medical History:  Diagnosis Date  . Migraine   . PVCs (premature ventricular contractions)   . 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 ADENOIDECTOMY    . TONSILLECTOMY    . wisdom teeth      There were no vitals filed for this visit.  Subjective Assessment - 08/15/17 1637    Subjective  5/10  Pain not yet improved.                         [facility] Adult PT Treatment/Exercise - 08/15/17 0001      Self-Care   Other Self-Care Comments   checked the fit of brace  .  It fits well      Knee/Hip Exercises: Prone   Hamstring Curl  10 reps    Hamstring Curl Limitations  AA flexion with eccentric focus, 2 sets 1 set with 2.5 LBS.      Ultrasound   Ultrasound Location  knee posterior lateral    Ultrasound Parameters  100%,  1.5 watts /cm2,  8 minutes    Ultrasound Goals  Pain      Manual Therapy   Manual Therapy  Soft tissue mobilization    Soft tissue mobilization  hamstrings with soft pool noodle to left hamstrings  medial >lateral.  tissue softened      Kinesiotix   Facilitate Muscle   quads and hamstrings             PT Education - 08/15/17 1724    Education Details  yes how [place] works    Forensic psychologist) Educated  Patient    Methods  Explanation    Comprehension  Verbalized understanding          PT Long Term Goals - 07/25/17 1140      PT LONG TERM GOAL #1   Title  Pt will be I with HEP for core, hips, knee     Baseline  given on eval     Time  6    Period  Weeks    Status  New    Target Date  09/12/17      PT LONG TERM GOAL #2   Title  Pt will be able to jog, complete normal practice routine with no increase in pain     Baseline  pain can be as high as 7/10 with her workouts     Time  6    Period  Weeks    Status  New    Target Date  09/12/17      PT LONG TERM GOAL #3   Title  Pt will demo 5/5 strength in quads, hip add, abduction to maximize pelvic stability needed for sport.     Baseline  4/5 knee ext and lateral hip     Time  6    Period  Weeks    Status  New    Target Date  09/12/17      PT LONG TERM GOAL #4   Title  Pt will perform steps without increasing pain, good eccentric control.     Baseline  painful , weak on descent     Time  6    Period  Weeks    Status  New    Target Date  09/12/17      PT LONG TERM GOAL #5   Title  Pt will be able to sit for >1 hour and report min noticeable stiffness in knee when standing     Baseline  limited to 20-30 min     Time  6    Period  Weeks    Status  New    Target Date  09/12/17            Plan - 08/15/17 1726    Clinical Impression Statement  Focus today on pain relief due to no lasting improvements. Less tension noted at end of session.  Tissue softened.   Exercises that are painful were placed on hold.     PT Next Visit Plan  continue hip strengthening.  retape as needed.  Continue paIN FREE EXERCISE.  mODALITIES AS NEEDED    PT Home Exercise Plan  3 way hip stretch, clam hip abd and SLR with VMO Single leg bridge, minisquat with side step,  band,  clams with band,  ( HOLD)quadriped from elbows:  hip extension with knee flexed and straight    Consulted and Agree with Plan of Care  Patient       Patient will benefit from  skilled therapeutic intervention in order to improve the following deficits and impairments:     Visit Diagnosis: Chronic pain of left knee  Muscle weakness (generalized)     Problem List Patient Active Problem List   Diagnosis Date Noted  . Breast mass 02/22/2017  . Contraception 02/27/2015  . Right knee pain 01/29/2013  . Ankle pain 02/01/2012  . Tinea versicolor 09/29/2010  . Eczema 04/04/2010  . Acne 04/01/2010  . TENDINITIS, PATELLAR 02/17/2010  . Migraine headache 12/02/2008    HARRIS,KAREN  PTA 08/15/2017, 5:31 PM  [facility] [address] [place], [place], [address] Phone: [phone]   Fax:  [phone]  Name: Hannah Page MRN: [id number] Date of Birth: June 28, 1995

## 2017-08-16 ENCOUNTER — Encounter: Payer: Self-pay | Admitting: Physical Therapy

## 2017-08-16 ENCOUNTER — Ambulatory Visit: Payer: Medicaid Other | Admitting: Physical Therapy

## 2017-08-16 DIAGNOSIS — M6281 Muscle weakness (generalized): Secondary | ICD-10-CM

## 2017-08-16 DIAGNOSIS — G8929 Other chronic pain: Secondary | ICD-10-CM

## 2017-08-16 DIAGNOSIS — M25562 Pain in left knee: Principal | ICD-10-CM

## 2017-08-16 NOTE — Therapy (Signed)
[place], [place], [address] Phone: [phone]   Fax:  [phone]  Physical Therapy Treatment  Patient Details  Name: Hannah Page MRN: [id number] Date of Birth: Nov 07, 1995 Referring Provider: Dr. Shellia Cleverly   Encounter Date: 08/16/2017  PT End of Session - 08/16/17 1413    Visit Number  5    Number of Visits  12    Date for PT Re-Evaluation  09/19/17    PT Start Time  1332    PT Stop Time  1428    PT Time Calculation (min)  56 min    Activity Tolerance  Patient tolerated treatment well    Behavior During Therapy  [facility] for tasks assessed/performed       Past Medical History:  Diagnosis Date  . Migraine   . PVCs (premature ventricular contractions)   . 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 ADENOIDECTOMY    . TONSILLECTOMY    . wisdom teeth      There were no vitals filed for this visit.  Subjective Assessment - 08/16/17 1359    Subjective  Yesterday helped.  i feel better.     Currently in Pain?  No/denies    Pain Location  Knee    Pain Orientation  Left                       [facility] Adult PT Treatment/Exercise - 08/16/17 0001      Knee/Hip Exercises: Aerobic   Recumbent Bike  5 minutes L1      Knee/Hip Exercises: Machines for Strengthening   Cybex Leg Press  10 X 2 sets , 1 plate up both,  lower one.,  no pain      Moist Heat Therapy   Number Minutes Moist Heat  10 Minutes    Moist Heat Location  Knee      Ultrasound   Ultrasound Location  knee posterior    Ultrasound Parameters  100 % 1.5 watts/cm2,  8 miutes    Ultrasound Goals  Pain      Manual Therapy   Manual Therapy  Soft tissue mobilization    Soft tissue mobilization  hamstrings with soft pool noodle to left hamstrings  medial >lateral.  tissue softened      Kinesiotix   Facilitate Muscle   hamstring removed and  replaced             PT Education - 08/16/17 1413    Education Details  No          PT Long Term Goals - 07/25/17 1140      PT LONG TERM GOAL #1   Title  Pt will be I with HEP for core, hips, knee     Baseline  given on eval     Time  6    Period  Weeks    Status  New    Target Date  09/12/17      PT LONG TERM GOAL #2   Title  Pt will be able to jog, complete normal practice routine with no increase in pain     Baseline  pain can be as high as 7/10 with her workouts     Time  6    Period  Weeks    Status  New    Target Date  09/12/17  PT LONG TERM GOAL #3   Title  Pt will demo 5/5 strength in quads, hip add, abduction to maximize pelvic stability needed for sport.     Baseline  4/5 knee ext and lateral hip     Time  6    Period  Weeks    Status  New    Target Date  09/12/17      PT LONG TERM GOAL #4   Title  Pt will perform steps without increasing pain, good eccentric control.     Baseline  painful , weak on descent     Time  6    Period  Weeks    Status  New    Target Date  09/12/17      PT LONG TERM GOAL #5   Title  Pt will be able to sit for >1 hour and report min noticeable stiffness in knee when standing     Baseline  limited to 20-30 min     Time  6    Period  Weeks    Status  New    Target Date  09/12/17            Plan - 08/16/17 1414    Clinical Impression Statement  Pain  improving.  pain control focus today.  She should be ready to progress exercises next visit.     PT Next Visit Plan  continue hip strengthening.  retape as needed.  Continue paIN FREE EXERCISE.  mODALITIES AS NEEDED    PT Home Exercise Plan  3 way hip stretch, clam hip abd and SLR with VMO Single leg bridge, minisquat with side step,  band,  clams with band,  ( HOLD)quadriped from elbows:  hip extension with knee flexed and straight    Consulted and Agree with Plan of Care  Patient       Patient will benefit from skilled therapeutic intervention in order to  improve the following deficits and impairments:     Visit Diagnosis: Chronic pain of left knee  Muscle weakness (generalized)     Problem List Patient Active Problem List   Diagnosis Date Noted  . Breast mass 02/22/2017  . Contraception 02/27/2015  . Right knee pain 01/29/2013  . Ankle pain 02/01/2012  . Tinea versicolor 09/29/2010  . Eczema 04/04/2010  . Acne 04/01/2010  . TENDINITIS, PATELLAR 02/17/2010  . Migraine headache 12/02/2008    HARRIS,KAREN PTA 08/16/2017, 2:17 PM  [facility] [address] [place], [place], [address] Phone: [phone]   Fax:  [phone]  Name: Hannah Page MRN: [id number] Date of Birth: 07/25/1995

## 2017-08-17 ENCOUNTER — Other Ambulatory Visit: Payer: Self-pay | Admitting: *Deleted

## 2017-08-19 MED ORDER — IBUPROFEN 600 MG PO TABS: 600.0000 mg | ORAL_TABLET | Freq: Three times a day (TID) | ORAL | 2 refills | Status: DC | PRN

## 2017-08-22 ENCOUNTER — Ambulatory Visit: Payer: Medicaid Other | Admitting: Physical Therapy

## 2017-08-22 ENCOUNTER — Encounter: Payer: Self-pay | Admitting: Physical Therapy

## 2017-08-22 DIAGNOSIS — M25562 Pain in left knee: Secondary | ICD-10-CM | POA: Diagnosis not present

## 2017-08-22 DIAGNOSIS — M6281 Muscle weakness (generalized): Secondary | ICD-10-CM

## 2017-08-22 DIAGNOSIS — G8929 Other chronic pain: Secondary | ICD-10-CM | POA: Diagnosis not present

## 2017-08-22 NOTE — Therapy (Signed)
[place], [place], [address] Phone: [phone]   Fax:  [phone]  Physical Therapy Treatment  Patient Details  Name: Hannah Page MRN: [id number] Date of Birth: 1995-03-21 Referring Provider: Dr. Shellia Cleverly   Encounter Date: 08/22/2017  PT End of Session - 08/22/17 1130    Visit Number  6    Number of Visits  12    Date for PT Re-Evaluation  09/19/17    PT Start Time  1115    PT Stop Time  1205    PT Time Calculation (min)  50 min    Activity Tolerance  Patient tolerated treatment well    Behavior During Therapy  [facility] for tasks assessed/performed       Past Medical History:  Diagnosis Date  . Migraine   . PVCs (premature ventricular contractions)   . 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 ADENOIDECTOMY    . TONSILLECTOMY    . wisdom teeth      There were no vitals filed for this visit.  Subjective Assessment - 08/22/17 1117    Subjective  No pain right now.  Sat had pain 7/10 it depends what I'm doing.      Currently in Pain?  No/denies         [facility] PT Assessment - 08/22/17 0001      Strength   Right Hip Flexion  5/5    Left Hip Flexion  5/5    Left Knee Flexion  5/5    Left Knee Extension  4+/5 pain post           [facility] Adult PT Treatment/Exercise - 08/22/17 0001      Knee/Hip Exercises: Standing   Forward Lunges  Both;1 set;10 reps    Side Lunges  Both;1 set;10 reps    Hip Abduction  Stengthening;Both;1 set;10 reps    Lateral Step Up  Left;1 set;20 reps;Hand Hold: 0;Step Height: 8"    Forward Step Up  Left;1 set;20 reps;Hand Hold: 0;Step Height: 8"    Functional Squat  10 reps      Knee/Hip Exercises: Supine   Bridges  Strengthening;Both;1 set    Bridges with Clamshell  Strengthening;Both;1 set    Bridges with Clamshell  -- bridge with knee ext     Single Leg Bridge   Strengthening;Both;1 set    Straight Leg Raises  Strengthening;Left;1 set;15 reps    Straight Leg Raise with External Rotation  Strengthening;Left;1 set;15 reps      Knee/Hip Exercises: Sidelying   Hip ABduction  Both;1 set;10 reps    Hip ADduction  Both;1 set;10 reps    Clams  blue band x 20 L side       Cryotherapy   Number Minutes Cryotherapy  10 Minutes    Cryotherapy Location  Knee    Type of Cryotherapy  Ice pack      Manual Therapy   Kinesiotex  Facilitate Muscle      Kinesiotix   Facilitate Muscle   quad and hamstring 2 Y's           PT Long Term Goals - 08/22/17 1130      PT LONG TERM GOAL #1   Title  Pt will be I with HEP for core, hips, knee     Baseline  up to date     Status  On-going  PT LONG TERM GOAL #2   Title  Pt will be able to jog, complete normal practice routine with no increase in pain     Baseline  pain can be as high as 7/10 with her workouts     Status  On-going      PT LONG TERM GOAL #3   Title  Pt will demo 5/5 strength in quads, hip add, abduction to maximize pelvic stability needed for sport.     Status  On-going      PT LONG TERM GOAL #4   Title  Pt will perform steps without increasing pain, good eccentric control.     Baseline  min pain descending     Status  On-going      PT LONG TERM GOAL #5   Title  Pt will be able to sit for >1 hour and report min noticeable stiffness in knee when standing     Baseline  30 min, improving     Status  On-going            Plan - 08/22/17 1238    Clinical Impression Statement  Min pain increase in knee pain with step ups.  Has plenty of exercises to do at home.  She can manage her pain with dance activities, uses RICE and can resolve within an hour.      PT Frequency  2x / week    PT Duration  6 weeks    PT Treatment/Interventions  ADLs/Self Care Home Management;Electrical Stimulation;Functional mobility training;Neuromuscular re-education;Taping;Therapeutic activities;Therapeutic  exercise;Patient/family education;Manual techniques;Ultrasound;Cryotherapy;Iontophoresis 4mg /ml Dexamethasone    PT Next Visit Plan  continue hip strengthening.  retape as needed.     PT Home Exercise Plan  3 way hip stretch, clam hip abd and SLR with VMO Single leg bridge, minisquat with side step,  band,  clams with band,  ( HOLD)quadriped from elbows:  hip extension with knee flexed and straight    Consulted and Agree with Plan of Care  Patient       Patient will benefit from skilled therapeutic intervention in order to improve the following deficits and impairments:  Decreased mobility, Decreased range of motion, Decreased strength, Postural dysfunction, Pain  Visit Diagnosis: Chronic pain of left knee  Muscle weakness (generalized)     Problem List Patient Active Problem List   Diagnosis Date Noted  . Breast mass 02/22/2017  . Contraception 02/27/2015  . Right knee pain 01/29/2013  . Ankle pain 02/01/2012  . Tinea versicolor 09/29/2010  . Eczema 04/04/2010  . Acne 04/01/2010  . TENDINITIS, PATELLAR 02/17/2010  . Migraine headache 12/02/2008    Abhijot Straughter 08/22/2017, 12:46 PM  [facility] [address] [place], [place], [address] Phone: [phone]   Fax:  [phone]  Name: AUBRIA VANECEK MRN: [id number] Date of Birth: 1995-10-28  Raeford Razor, PT 08/22/17 12:46 PM Phone: [phone] Fax: [phone]

## 2017-08-23 ENCOUNTER — Ambulatory Visit: Payer: Medicaid Other | Admitting: Physical Therapy

## 2017-08-23 ENCOUNTER — Encounter: Payer: Self-pay | Admitting: Physical Therapy

## 2017-08-23 DIAGNOSIS — M25562 Pain in left knee: Principal | ICD-10-CM

## 2017-08-23 DIAGNOSIS — G8929 Other chronic pain: Secondary | ICD-10-CM

## 2017-08-23 DIAGNOSIS — M6281 Muscle weakness (generalized): Secondary | ICD-10-CM

## 2017-08-23 NOTE — Therapy (Signed)
[place], [place], [address] Phone: [phone]   Fax:  [phone]  Physical Therapy Treatment  Patient Details  Name: Hannah Page MRN: [id number] Date of Birth: 1995/05/06 Referring Provider: Dr. Shellia Cleverly   Encounter Date: 08/23/2017  PT End of Session - 08/23/17 1112    Visit Number  7    Number of Visits  12    Date for PT Re-Evaluation  09/19/17    PT Start Time  1104    PT Stop Time  1152    PT Time Calculation (min)  48 min    Activity Tolerance  Patient tolerated treatment well    Behavior During Therapy  [facility] for tasks assessed/performed       Past Medical History:  Diagnosis Date  . Migraine   . PVCs (premature ventricular contractions)   . 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 ADENOIDECTOMY    . TONSILLECTOMY    . wisdom teeth      There were no vitals filed for this visit.  Subjective Assessment - 08/23/17 1105    Subjective  A little bit of pain when I go up to stand.  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     Currently in Pain?  No/denies         Pilates Reformer used for LE/core strength, postural strength, lumbopelvic disassociation and core control.  Exercises included:  Used springboard for Footwork  2 Red 1 blue double and single leg work: focus on alignment and control.  NO issues with lumbopelvic stability.   Jumping 1 Red 1 Yellow: double and single leg: Parallel and turnout, x 10 x 2 sets Single leg x 10 x 2 sets each and then alternating Narrow, wide (1 Red and 1 Blue)   Feet in Straps 1 Red 1 blue Arcs Circles and Squats all x 10 each LE  Scooter 1 red x 10 press back added reverse lunge x 8 , cues to use less UE support        [facility] Adult PT Treatment/Exercise - 08/23/17 0001      Pilates   Pilates Reformer  See note       Cryotherapy   Number Minutes Cryotherapy  10 Minutes    Cryotherapy Location  Knee    Type of Cryotherapy  Ice pack             PT Long Term Goals - 08/22/17 1130      PT LONG TERM GOAL #1   Title  Pt will be I with HEP for core, hips, knee     Baseline  up to date     Status  On-going      PT LONG TERM GOAL #2   Title  Pt will be able to jog, complete normal practice routine with no increase in pain     Baseline  pain can be as high as 7/10 with her workouts     Status  On-going      PT LONG TERM GOAL #3   Title  Pt will demo 5/5 strength in quads, hip add, abduction to maximize pelvic stability needed for sport.     Status  On-going      PT LONG TERM GOAL #4   Title  Pt will perform steps without increasing pain, good eccentric control.  Baseline  min pain descending     Status  On-going      PT LONG TERM GOAL #5   Title  Pt will be able to sit for >1 hour and report min noticeable stiffness in knee when standing     Baseline  30 min, improving     Status  On-going            Plan - 08/23/17 1114    Clinical Impression Statement  No pain increase during Reformer exercises, including jumping. Has good body awareness and was able to isolate Lt LE weakness with single leg work.      PT Treatment/Interventions  ADLs/Self Care Home Management;Electrical Stimulation;Functional mobility training;Neuromuscular re-education;Taping;Therapeutic activities;Therapeutic exercise;Patient/family education;Manual techniques;Ultrasound;Cryotherapy;Iontophoresis 4mg /ml Dexamethasone    PT Next Visit Plan  continue hip strengthening.  retape as needed. How was Reformer/jumping     PT Home Exercise Plan  3 way hip stretch, clam hip abd and SLR with VMO Single leg bridge, minisquat with side step,  band,  clams with band,  ( HOLD)quadriped from elbows:  hip extension with knee flexed and straight    Consulted and Agree with Plan of Care  Patient       Patient will benefit from skilled  therapeutic intervention in order to improve the following deficits and impairments:  Decreased mobility, Decreased range of motion, Decreased strength, Postural dysfunction, Pain  Visit Diagnosis: Chronic pain of left knee  Muscle weakness (generalized)     Problem List Patient Active Problem List   Diagnosis Date Noted  . Breast mass 02/22/2017  . Contraception 02/27/2015  . Right knee pain 01/29/2013  . Ankle pain 02/01/2012  . Tinea versicolor 09/29/2010  . Eczema 04/04/2010  . Acne 04/01/2010  . TENDINITIS, PATELLAR 02/17/2010  . Migraine headache 12/02/2008    PAA,JENNIFER 08/23/2017, 11:44 AM  [facility] [address] [place], [place], [address] Phone: [phone]   Fax:  [phone]  Name: MADDELYNN MOOSMAN MRN: [id number] Date of Birth: 1995/10/09   Raeford Razor, PT 08/23/17 11:44 AM Phone: [phone] Fax: [phone]

## 2017-08-28 ENCOUNTER — Encounter: Payer: Self-pay | Admitting: Physical Therapy

## 2017-08-28 ENCOUNTER — Ambulatory Visit: Payer: Medicaid Other | Attending: Sports Medicine | Admitting: Physical Therapy

## 2017-08-28 DIAGNOSIS — M6281 Muscle weakness (generalized): Secondary | ICD-10-CM | POA: Diagnosis not present

## 2017-08-28 DIAGNOSIS — M25562 Pain in left knee: Secondary | ICD-10-CM | POA: Insufficient documentation

## 2017-08-28 DIAGNOSIS — G8929 Other chronic pain: Secondary | ICD-10-CM

## 2017-08-28 NOTE — Patient Instructions (Signed)

## 2017-08-28 NOTE — Therapy (Signed)
[place], [place], [address] Phone: [phone]   Fax:  [phone]  Physical Therapy Treatment  Patient Details  Name: Hannah Page MRN: [id number] Date of Birth: Jan 13, 1996 Referring Provider: Dr. Shellia Cleverly   Encounter Date: 08/28/2017  PT End of Session - 08/28/17 0916    Visit Number  8    Number of Visits  12    Date for PT Re-Evaluation  09/19/17    Authorization Time Period  7/15- 8/25     Authorization - Number of Visits  12    PT Start Time  0858    PT Stop Time  0930    PT Time Calculation (min)  32 min    Activity Tolerance  Patient tolerated treatment well    Behavior During Therapy  [facility] for tasks assessed/performed       Past Medical History:  Diagnosis Date  . Migraine   . PVCs (premature ventricular contractions)   . 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 ADENOIDECTOMY    . TONSILLECTOMY    . wisdom teeth      There were no vitals filed for this visit.  Subjective Assessment - 08/28/17 0859    Subjective  Has band camp all week.  On a break right now.  It was hurting as i was doing butt kicks.     Currently in Pain?  No/denies was 6/10 this AM  but it went away           [facility] Adult PT Treatment/Exercise - 08/28/17 0001      Knee/Hip Exercises: Stretches   Other Knee/Hip Stretches  3 way hip stretch LLE       Knee/Hip Exercises: Standing   Other Standing Knee Exercises  single leg dead lift x 10 each, UE needed at least 1 Hand     Other Standing Knee Exercises  did another set without any UE, small ROM       Knee/Hip Exercises: Supine   Other Supine Knee/Hip Exercises  hip extension with black theraband for post knee dynamic strength x 10       Knee/Hip Exercises: Sidelying   Other Sidelying Knee/Hip Exercises  4 way hip: flex, et, add, abd x 20 each post tape       Iontophoresis   Type of Iontophoresis  Dexamethasone    Location  L posteromedial knee    Dose  1 cc     Time  6 hr patch       Manual Therapy   Kinesiotex  Facilitate Muscle      Kinesiotix   Facilitate Muscle   quad and hamstring 2 Y's              PT Education - 08/28/17 0920    Education Details  ionto patch     Person(s) Educated  Patient    Methods  Demonstration;Explanation;Handout    Comprehension  Verbalized understanding          PT Long Term Goals - 08/22/17 1130      PT LONG TERM GOAL #1   Title  Pt will be I with HEP for core, hips, knee     Baseline  up to date     Status  On-going      PT LONG TERM GOAL #2   Title  Pt will be able  to jog, complete normal practice routine with no increase in pain     Baseline  pain can be as high as 7/10 with her workouts     Status  On-going      PT LONG TERM GOAL #3   Title  Pt will demo 5/5 strength in quads, hip add, abduction to maximize pelvic stability needed for sport.     Status  On-going      PT LONG TERM GOAL #4   Title  Pt will perform steps without increasing pain, good eccentric control.     Baseline  min pain descending     Status  On-going      PT LONG TERM GOAL #5   Title  Pt will be able to sit for >1 hour and report min noticeable stiffness in knee when standing     Baseline  30 min, improving     Status  On-going            Plan - 08/28/17 0902    Clinical Impression Statement  Patient was a bit late today.  Emphasized hip strength and stability in standing.  Applied tape as well as ionto patch to see if that might help posterior knee pain.     PT Treatment/Interventions  ADLs/Self Care Home Management;Electrical Stimulation;Functional mobility training;Neuromuscular re-education;Taping;Therapeutic activities;Therapeutic exercise;Patient/family education;Manual techniques;Ultrasound;Cryotherapy;Iontophoresis 4mg /ml Dexamethasone    PT Next Visit Plan  continue hip strengthening.   retape as needed. How was Reformer/jumping     PT Home Exercise Plan  3 way hip stretch, clam hip abd and SLR with VMO Single leg bridge, minisquat with side step,  band,  clams with band,  ( HOLD)quadriped from elbows:  hip extension with knee flexed and straight    Consulted and Agree with Plan of Care  Patient       Patient will benefit from skilled therapeutic intervention in order to improve the following deficits and impairments:  Decreased mobility, Decreased range of motion, Decreased strength, Postural dysfunction, Pain  Visit Diagnosis: Chronic pain of left knee  Muscle weakness (generalized)     Problem List Patient Active Problem List   Diagnosis Date Noted  . Breast mass 02/22/2017  . Contraception 02/27/2015  . Right knee pain 01/29/2013  . Ankle pain 02/01/2012  . Tinea versicolor 09/29/2010  . Eczema 04/04/2010  . Acne 04/01/2010  . TENDINITIS, PATELLAR 02/17/2010  . Migraine headache 12/02/2008    PAA,JENNIFER 08/28/2017, 9:35 AM  [place], [place], [address] Phone: [phone]   Fax:  [phone]  Name: Hannah Page MRN: [id number] Date of Birth: 04/26/1995  Raeford Razor, PT 08/28/17 9:35 AM Phone: [phone] Fax: [phone]

## 2017-08-30 ENCOUNTER — Ambulatory Visit: Payer: Medicaid Other | Admitting: Physical Therapy

## 2017-09-04 ENCOUNTER — Ambulatory Visit: Payer: Medicaid Other | Admitting: Physical Therapy

## 2017-09-04 ENCOUNTER — Encounter: Payer: Self-pay | Admitting: Physical Therapy

## 2017-09-04 DIAGNOSIS — M6281 Muscle weakness (generalized): Secondary | ICD-10-CM | POA: Diagnosis not present

## 2017-09-04 DIAGNOSIS — G8929 Other chronic pain: Secondary | ICD-10-CM

## 2017-09-04 DIAGNOSIS — M25562 Pain in left knee: Secondary | ICD-10-CM | POA: Diagnosis not present

## 2017-09-04 NOTE — Therapy (Signed)
[place], [place], [address] Phone: [phone]   Fax:  [phone]  Physical Therapy Treatment  Patient Details  Name: Hannah Page MRN: [id number] Date of Birth: Nov 27, 1995 Referring Provider: Dr. Shellia Cleverly   Encounter Date: 09/04/2017  PT End of Session - 09/04/17 1302    Visit Number  9    Number of Visits  12    Date for PT Re-Evaluation  09/19/17    PT Start Time  0900   short session patient late   PT Stop Time  0930    PT Time Calculation (min)  30 min    Activity Tolerance  Patient tolerated treatment well    Behavior During Therapy  [facility] for tasks assessed/performed       Past Medical History:  Diagnosis Date  . Migraine   . PVCs (premature ventricular contractions)   . 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 ADENOIDECTOMY    . TONSILLECTOMY    . wisdom teeth      There were no vitals filed for this visit.  Subjective Assessment - 09/04/17 0901    Subjective  No pain now.  pain at practice 6-7/10  better with consistantly icing post .    Currently in Pain?  No/denies    Pain Score  --   up to 6-7/10   Pain Location  Knee    Pain Orientation  Left    Pain Descriptors / Indicators  Sore    Pain Type  Chronic pain    Pain Frequency  Intermittent    Aggravating Factors   longer squatting  "BUCK"  which requires poor knee position  stress to medial knee    Pain Relieving Factors  ice,  brace,  tape    Effect of Pain on Daily Activities  limits  dancing    Multiple Pain Sites  No                       [facility] Adult PT Treatment/Exercise - 09/04/17 0001      Knee/Hip Exercises: Standing   Other Standing Knee Exercises  dance moves tried to modify her technique      Knee/Hip Exercises: Supine   Single Leg Bridge  --   2-3 reps at a time,  6 reps total  fatigued.     Ultrasound   Ultrasound Location  medial knee    Ultrasound Parameters  100% 1.5 watts 1cm2, 8 minutes    Ultrasound Goals  Pain      Manual Therapy   Kinesiotex  Inhibit Muscle;Facilitate Muscle;Ligament Correction      Kinesiotix   Inhibit Muscle   Anterior tibialis    Facilitate Muscle   quad and hamstring 2 Y's     Ligament Correction  medial knee 75%             PT Education - 09/04/17 1302    Education Details  Dance moves modification    Person(s) Educated  Patient    Methods  Explanation    Comprehension  Verbalized understanding          PT Long Term Goals - 09/04/17 1308      PT LONG TERM GOAL #1   Title  Pt will be I with HEP for core, hips, knee     Baseline  up to  date     Period  Weeks    Status  On-going      PT LONG TERM GOAL #2   Title  Pt will be able to jog, complete normal practice routine with no increase in pain     Baseline  6-7 pain with workout    Time  6    Period  Weeks    Status  On-going      PT LONG TERM GOAL #3   Title  Pt will demo 5/5 strength in quads, hip add, abduction to maximize pelvic stability needed for sport.     Time  6    Status  Unable to assess      PT LONG TERM GOAL #4   Title  Pt will perform steps without increasing pain, good eccentric control.     Time  6    Period  Weeks    Status  Unable to assess      PT LONG TERM GOAL #5   Title  Pt will be able to sit for >1 hour and report min noticeable stiffness in knee when standing     Time  6    Status  Unable to assess            Plan - 09/04/17 1303    Clinical Impression Statement  patient Was exhausted from starting dance at 5:30 this morning.  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  Patient unable/unwilling to modify due to it is the only way she can do the signature move for [employer] dance team.  tape worked better than ionto due to it falling off.  Trial medial knee tape.  No paoin at end of session.  She has hours more practice today.  She did fine with Reformer jumping last visit.     PT Next Visit Plan  continue hip strengthening.  retape as needed.  See how medial knee tape did.  teach how to tape if helpful    PT Home Exercise Plan  3 way hip stretch, clam hip abd and SLR with VMO Single leg bridge, minisquat with side step,  band,  clams with band,  ( HOLD)quadriped from elbows:  hip extension with knee flexed and straight    Consulted and Agree with Plan of Care  Patient       Patient will benefit from skilled therapeutic intervention in order to improve the following deficits and impairments:     Visit Diagnosis: Chronic pain of left knee  Muscle weakness (generalized)     Problem List Patient Active Problem List   Diagnosis Date Noted  . Breast mass 02/22/2017  . Contraception 02/27/2015  . Right knee pain 01/29/2013  . Ankle pain 02/01/2012  . Tinea versicolor 09/29/2010  . Eczema 04/04/2010  . Acne 04/01/2010  . TENDINITIS, PATELLAR 02/17/2010  . Migraine headache 12/02/2008    Jakiah Bienaime PTA 09/04/2017, 1:10 PM  [facility] [address] [place], [place], [address] Phone: [phone]   Fax:  [phone]  Name: Hannah Page MRN: [id number] Date of Birth: [birth year]

## 2017-09-06 ENCOUNTER — Encounter: Payer: Self-pay | Admitting: Physical Therapy

## 2017-09-06 ENCOUNTER — Ambulatory Visit: Payer: Medicaid Other | Admitting: Physical Therapy

## 2017-09-06 DIAGNOSIS — G8929 Other chronic pain: Secondary | ICD-10-CM | POA: Diagnosis not present

## 2017-09-06 DIAGNOSIS — M25562 Pain in left knee: Principal | ICD-10-CM

## 2017-09-06 DIAGNOSIS — M6281 Muscle weakness (generalized): Secondary | ICD-10-CM | POA: Diagnosis not present

## 2017-09-06 NOTE — Therapy (Signed)
[place], [place], [address] Phone: [phone]   Fax:  [phone]  Physical Therapy Treatment  Patient Details  Name: Hannah Page MRN: [id number] Date of Birth: 02-20-1995 Referring Provider: Dr. Shellia Cleverly   Encounter Date: 09/06/2017  PT End of Session - 09/06/17 1213    Visit Number  10    Number of Visits  12    Date for PT Re-Evaluation  09/19/17    Authorization Time Period  7/15- 8/25     PT Start Time  0903   late arrival    PT Stop Time  0930    PT Time Calculation (min)  27 min    Activity Tolerance  Patient tolerated treatment well    Behavior During Therapy  [facility] for tasks assessed/performed       Past Medical History:  Diagnosis Date  . Migraine   . PVCs (premature ventricular contractions)   . 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 ADENOIDECTOMY    . TONSILLECTOMY    . wisdom teeth      There were no vitals filed for this visit.  Subjective Assessment - 09/06/17 0908    Subjective  No pain right now.  Late due to traffic.      Currently in Pain?  No/denies             [facility] Adult PT Treatment/Exercise - 09/06/17 0001      Self-Care   Other Self-Care Comments   took photo of her tape, HEP and pure Barre as a post PT mode of fitness, DC      Knee/Hip Exercises: Stretches   Other Knee/Hip Stretches  3 way hip stretch LLE    30 sec with strap      Knee/Hip Exercises: Standing   Functional Squat  2 sets;10 reps   hold , small pulse    Functional Squat Limitations  5 sec hold slow and controlled   against the wall      Kinesiotix   Inhibit Muscle   Anterior tibialis, reapplied     Ligament Correction  r             PT Education - 09/06/17 1211    Education Details  see flow sheet     Person(s) Educated  Patient    Methods  Explanation;Handout    Comprehension   Verbalized understanding;Returned demonstration          PT Long Term Goals - 09/06/17 1334      PT LONG TERM GOAL #1   Title  Pt will be I with HEP for core, hips, knee     Status  Achieved      PT LONG TERM GOAL #2   Title  Pt will be able to jog, complete normal practice routine with no increase in pain     Baseline  improved but can be 5/10 at times with more challenging moves     Status  Partially Met      PT LONG TERM GOAL #3   Title  Pt will demo 5/5 strength in quads, hip add, abduction to maximize pelvic stability needed for sport.     Baseline  min weak in hip abd and ER     Status  Partially Met      PT LONG TERM GOAL #4   Title  Pt will perform steps without increasing pain, good eccentric control.     Status  Achieved      PT LONG TERM GOAL #5   Title  Pt will be able to sit for >1 hour and report min noticeable stiffness in knee when standing     Status  Partially Met            Plan - 09/06/17 1216    Clinical Impression Statement  Patient was shown ways to work on endurance and form in hip abduction and external rotation to reduce medial knee stress in a common dance move she has to perform.  Some of it is that she has to do the move in a tight space.  She feels ready for DC and she can do HEP at home.  Offered name and type of tape for when she is more active.     PT Next Visit Plan  NA    PT Home Exercise Plan  3 way hip stretch, clam hip abd and SLR with VMO Single leg bridge, minisquat with side step,  sumo squat, band,  clams with band,  ( HOLD)quadriped from elbows:  hip extension with knee flexed and straight    Consulted and Agree with Plan of Care  Patient       Patient will benefit from skilled therapeutic intervention in order to improve the following deficits and impairments:  Decreased mobility, Decreased range of motion, Decreased strength, Postural dysfunction, Pain  Visit Diagnosis: Chronic pain of left knee  Muscle weakness  (generalized)     Problem List Patient Active Problem List   Diagnosis Date Noted  . Breast mass 02/22/2017  . Contraception 02/27/2015  . Right knee pain 01/29/2013  . Ankle pain 02/01/2012  . Tinea versicolor 09/29/2010  . Eczema 04/04/2010  . Acne 04/01/2010  . TENDINITIS, PATELLAR 02/17/2010  . Migraine headache 12/02/2008    PAA,JENNIFER 09/06/2017, 1:36 PM  [facility] [address] [place], [place], [address] Phone: [phone]   Fax:  [phone]  Name: LYNSI DOONER MRN: [id number] Date of Birth: 1995-11-21    PHYSICAL THERAPY DISCHARGE SUMMARY  Visits from Start of Care: 10  Current functional level related to goals / functional outcomes: See above.  Pain can be 6/10 with activity, dance  But it does resolve quite quickly.    Remaining deficits: Knee alignment    Education / Equipment: Posture, LE alignment, RICE, tape , HEP  Plan: Patient agrees to discharge.  Patient goals were met. Patient is being discharged due to being pleased with the current functional level.  ?????     Raeford Razor, PT 09/06/17 1:36 PM Phone: [phone] Fax: [phone]

## 2017-09-29 DIAGNOSIS — H5213 Myopia, bilateral: Secondary | ICD-10-CM | POA: Diagnosis not present

## 2017-11-29 DIAGNOSIS — R350 Frequency of micturition: Secondary | ICD-10-CM | POA: Diagnosis not present

## 2017-11-29 DIAGNOSIS — B9689 Other specified bacterial agents as the cause of diseases classified elsewhere: Secondary | ICD-10-CM | POA: Diagnosis not present

## 2017-11-29 DIAGNOSIS — N760 Acute vaginitis: Secondary | ICD-10-CM | POA: Diagnosis not present

## 2017-11-29 DIAGNOSIS — N938 Other specified abnormal uterine and vaginal bleeding: Secondary | ICD-10-CM | POA: Diagnosis not present

## 2017-11-29 DIAGNOSIS — Z3202 Encounter for pregnancy test, result negative: Secondary | ICD-10-CM | POA: Diagnosis not present

## 2017-12-25 ENCOUNTER — Other Ambulatory Visit: Payer: Self-pay | Admitting: Family Medicine

## 2017-12-27 DIAGNOSIS — H5213 Myopia, bilateral: Secondary | ICD-10-CM | POA: Diagnosis not present

## 2017-12-27 DIAGNOSIS — H52222 Regular astigmatism, left eye: Secondary | ICD-10-CM | POA: Diagnosis not present

## 2018-01-29 ENCOUNTER — Other Ambulatory Visit: Payer: Self-pay | Admitting: Family Medicine

## 2018-01-29 DIAGNOSIS — N6320 Unspecified lump in the left breast, unspecified quadrant: Secondary | ICD-10-CM

## 2018-02-20 ENCOUNTER — Ambulatory Visit (INDEPENDENT_AMBULATORY_CARE_PROVIDER_SITE_OTHER): Payer: Medicaid Other | Admitting: Family Medicine

## 2018-02-20 ENCOUNTER — Telehealth: Payer: Self-pay | Admitting: *Deleted

## 2018-02-20 ENCOUNTER — Encounter: Payer: Self-pay | Admitting: Family Medicine

## 2018-02-20 ENCOUNTER — Other Ambulatory Visit: Payer: Self-pay

## 2018-02-20 VITALS — BP 110/58 | HR 72 | Temp 98.5°F | Ht 65.0 in | Wt 145.0 lb

## 2018-02-20 DIAGNOSIS — Z3201 Encounter for pregnancy test, result positive: Secondary | ICD-10-CM | POA: Diagnosis not present

## 2018-02-20 DIAGNOSIS — N926 Irregular menstruation, unspecified: Secondary | ICD-10-CM | POA: Diagnosis not present

## 2018-02-20 LAB — POCT URINE PREGNANCY: PREG TEST UR: POSITIVE — AB

## 2018-02-20 NOTE — Telephone Encounter (Signed)
Informed pt at visit of this. Katharina Caper, April D, [place]

## 2018-02-20 NOTE — Telephone Encounter (Signed)
Tried to contact pt to inform her that she is coming to early for a physical.  Last physical was 04/21/2017.  Wanted to see if there was anything else that the pt needed to be seen for. Katharina Caper, Alahni Varone D, [place]

## 2018-02-20 NOTE — Progress Notes (Signed)
U

## 2018-02-20 NOTE — Patient Instructions (Signed)
It was good to see you today.  You can schedule an appointment with [facility] online.    Go to the following address: - [address] [place], [place] [address]  Keep me updated

## 2018-02-21 ENCOUNTER — Encounter: Payer: Self-pay | Admitting: Family Medicine

## 2018-02-21 DIAGNOSIS — Z3201 Encounter for pregnancy test, result positive: Secondary | ICD-10-CM | POA: Insufficient documentation

## 2018-02-21 NOTE — Assessment & Plan Note (Signed)
Patient unsure whether she wants to keep baby.  [facility].  Gave her info on appointments, she will go tomorrow (we looked at these together online after I tried calling but couldn't get through).  - 1 episode painless bleeding.  None since then.  No abd pain or concern for ectopic.  - discussed obtaining US for possible chorionic hemorrhage, dating, etc.  [facility].   - No red flags  - she will contact me via phone or mychart after talking [facility] re: her future plans.  Prenatal vitamin if she keeps baby.

## 2018-02-21 NOTE — Progress Notes (Signed)
Subjective:    Hannah Page is a 23 y.o. female who presents to [facility] today for amenorrhea:  1.  Amenorrhea:  Present x 3 months.  Unprotected sexual intercourse with same partner.  No D/C, no concern for STDs.  She is worried she is pregnant.  Went without OCPs for 1 week about 3 months ago, preceding amenorrhea.  No pregnancy tests at home.  1 episode of painless vaginal bleeding that occurred on Sunday during urination, none since then.  "Small amount" less than usual period.  No abdominal pain.  Has had some nausea in AM and 1 episode of vomiting several weeks ago but none since then.  ROS positive only for constipation which is fairly new for her.  No migraines/headaches.  No LE edema.    Boyfriend/partner present with her today.     ROS as above per HPI.    The following portions of the patient's history were reviewed and updated as appropriate: allergies, current medications, past medical history, family and social history, and problem list. Patient is a nonsmoker.    PMH reviewed.  Past Medical History:  Diagnosis Date  . Migraine   . PVCs (premature ventricular contractions)   . 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 ADENOIDECTOMY    . TONSILLECTOMY    . wisdom teeth      Medications reviewed. Current Outpatient Medications  Medication Sig Dispense Refill  . acetaminophen (TYLENOL) 500 MG tablet Take by mouth.    Marland Kitchen CAMILA 0.35 MG tablet TAKE 1 TABLET BY MOUTH EVERY DAY 28 tablet 3  . ibuprofen (ADVIL,MOTRIN) 600 MG tablet Take 1 tablet (600 mg total) by mouth every 8 (eight) hours as needed. 30 tablet 2  . meloxicam (MOBIC) 15 MG tablet Take 1 tablet (15 mg) by mouth with food daily as needed. 30 tablet 0  . promethazine (PHENERGAN) 12.5 MG tablet TAKE 1 TABLET BY MOUTH EVERY 8 HOURS AS NEEDED FOR NAUSEA AND VOMITING 20 tablet 1   No current facility-administered medications for this visit.       Objective:   Physical Exam BP (!) 110/58   Pulse 72   Temp 98.5 F (36.9 C) (Oral)   Ht 5\' 5"  (1.651 m)   Wt 145 lb (65.8 kg)   SpO2 99%   BMI 24.13 kg/m  Gen:  Alert, cooperative patient who appears stated age in no acute distress.  Vital signs reviewed. HEENT: EOMI,  MMM Cardiac:  Regular rate and rhythm without murmur auscultated.  Good S1/S2. Pulm:  Clear to auscultation bilaterally with good air movement.  No wheezes or rales noted.   Abd:  Soft/nondistended/nontender.  Uterus not palpable.  Psych:  Tearful after learning about positive pregnancy test.  Appropriate    Results for orders placed or performed in visit on 02/20/18 (from the past 72 hour(s))  POCT urine pregnancy     Status: Abnormal   Collection Time: 02/20/18 12:30 PM  Result Value Ref Range   Preg Test, Ur Positive (A) Negative

## 2018-02-27 ENCOUNTER — Other Ambulatory Visit: Payer: Self-pay | Admitting: Family Medicine

## 2018-03-06 ENCOUNTER — Other Ambulatory Visit: Payer: Medicaid Other

## 2018-03-06 ENCOUNTER — Other Ambulatory Visit: Payer: Self-pay | Admitting: Family Medicine

## 2018-03-06 DIAGNOSIS — N63 Unspecified lump in unspecified breast: Secondary | ICD-10-CM

## 2018-03-07 ENCOUNTER — Ambulatory Visit
Admission: RE | Admit: 2018-03-07 | Discharge: 2018-03-07 | Disposition: A | Discharge status: Home / Self Care | Payer: Medicaid Other | Source: Ambulatory Visit | Attending: Family Medicine | Admitting: Family Medicine

## 2018-03-07 DIAGNOSIS — N63 Unspecified lump in unspecified breast: Secondary | ICD-10-CM

## 2018-03-07 DIAGNOSIS — N6321 Unspecified lump in the left breast, upper outer quadrant: Secondary | ICD-10-CM | POA: Diagnosis not present

## 2018-03-24 ENCOUNTER — Other Ambulatory Visit: Payer: Self-pay | Admitting: Family Medicine

## 2018-03-25 ENCOUNTER — Other Ambulatory Visit: Payer: Self-pay | Admitting: Family Medicine

## 2018-03-29 NOTE — Telephone Encounter (Signed)
Contacted pt and she said that she is not pregnant and that she had the procedure done on Feb 11th she believes. Told her I would send message to PCP and if there was no reason for her to come in he would send in her [facility]. Routing to PCP.   Katharina Caper, Adams Hinch D, [place]

## 2018-03-29 NOTE — Telephone Encounter (Signed)
Will you call and ask if Hannah Page is pregnant?  If not, I will refill her birth control.  Thanks!

## 2018-04-02 NOTE — Telephone Encounter (Signed)
Pt informed that Rx had been filled.Katharina Caper, April D, [place]

## 2018-04-16 ENCOUNTER — Telehealth: Payer: Self-pay | Admitting: Family Medicine

## 2018-04-16 NOTE — Telephone Encounter (Signed)
Patient calling clinic today to talk to Dr. Mingo Amber and is hopeful to see Dr. Mingo Amber in clinic.  Explained to patient that given recent coronavirus concerns we are trying to keep healthy people at home as long as possible if they are able to do a telephone visit.  Patient is agreeable to telephone visit but only if Dr. Mingo Amber completes the call.  Did not want to speak to me.  Per patient she is having a reaction to her clindamycin cream.  Has been prescribed this over a year ago and has been taking without issues but recently has had allergic reaction.  Did not want to discuss this further.  Informed patient that I will forward this to Dr. Mingo Amber so that he may discuss further with her since she would not like to discuss it with me.  Dalphine Handing, PGY-2 [facility] 04/16/2018 2:02 PM

## 2018-04-17 MED ORDER — ADAPALENE-BENZOYL PEROXIDE 0.1-2.5 % EX GEL: CUTANEOUS | 0 refills | Status: DC

## 2018-04-17 NOTE — Telephone Encounter (Signed)
Called Hannah Page.  She is doing well.  Had some OTC eczema cream which resolved her symptoms.  She has also stopped the clindamycin ointment.  Sounds like allergic dermatitis to that.  Plan will be to stop Clinda and start Epiduo instead.  Hold off on using eczema cream any further unless symptoms.  Patient appreciative of call.

## 2018-04-17 NOTE — Addendum Note (Signed)
Addended byMingo Amber, Kayleen Memos on: 04/17/2018 02:52 PM   Modules accepted: Orders

## 2018-06-05 ENCOUNTER — Other Ambulatory Visit: Payer: Self-pay | Admitting: Family Medicine

## 2018-06-13 ENCOUNTER — Ambulatory Visit: Payer: Medicaid Other | Admitting: Family Medicine

## 2018-06-19 ENCOUNTER — Encounter: Payer: Self-pay | Admitting: Family Medicine

## 2018-06-19 ENCOUNTER — Other Ambulatory Visit: Payer: Self-pay

## 2018-06-19 ENCOUNTER — Ambulatory Visit (INDEPENDENT_AMBULATORY_CARE_PROVIDER_SITE_OTHER): Payer: Medicaid Other | Admitting: Family Medicine

## 2018-06-19 VITALS — BP 116/64 | HR 86

## 2018-06-19 DIAGNOSIS — N926 Irregular menstruation, unspecified: Secondary | ICD-10-CM | POA: Diagnosis not present

## 2018-06-19 DIAGNOSIS — M940 Chondrocostal junction syndrome [Tietze]: Secondary | ICD-10-CM | POA: Diagnosis not present

## 2018-06-19 DIAGNOSIS — B354 Tinea corporis: Secondary | ICD-10-CM | POA: Diagnosis not present

## 2018-06-19 LAB — POCT URINE PREGNANCY: Preg Test, Ur: NEGATIVE

## 2018-06-19 MED ORDER — CLOTRIMAZOLE-BETAMETHASONE 1-0.05 % EX CREA: 1.0000 "application " | TOPICAL_CREAM | Freq: Two times a day (BID) | CUTANEOUS | 0 refills | Status: DC

## 2018-06-19 MED ORDER — PROMETHAZINE HCL 12.5 MG PO TABS: ORAL_TABLET | ORAL | 1 refills | Status: DC

## 2018-06-19 NOTE — Patient Instructions (Signed)
It was good to see you again today!  Your pregnancy test was negative.    I have sent in the Lotrisone cream for your rash.  Do not use this on your face.  Your chest pain is called costochondritis.  More information below.  It's been great to take care of you!   Costochondritis Costochondritis is swelling and irritation (inflammation) of the tissue (cartilage) that connects your ribs to your breastbone (sternum). This causes pain in the front of your chest. Usually, the pain:  Starts gradually.  Is in more than one rib. This condition usually goes away on its own over time. Follow these instructions at home:  Do not do anything that makes your pain worse.  If directed, put ice on the painful area: ? Put ice in a plastic bag. ? Place a towel between your skin and the bag. ? Leave the ice on for 20 minutes, 2-3 times a day.  If directed, put heat on the affected area as often as told by your doctor. Use the heat source that your doctor tells you to use, such as a moist heat pack or a heating pad. ? Place a towel between your skin and the heat source. ? Leave the heat on for 20-30 minutes. ? Take off the heat if your skin turns bright red. This is very important if you cannot feel pain, heat, or cold. You may have a greater risk of getting burned.  Take over-the-counter and prescription medicines only as told by your doctor.  Return to your normal activities as told by your doctor. Ask your doctor what activities are safe for you.  Keep all follow-up visits as told by your doctor. This is important. Contact a doctor if:  You have chills or a fever.  Your pain does not go away or it gets worse.  You have a cough that does not go away. Get help right away if:  You are short of breath. This information is not intended to replace advice given to you by your health care provider. Make sure you discuss any questions you have with your health care provider. Document Released:  06/29/2007 Document Revised: 07/31/2015 Document Reviewed: 05/06/2015 Elsevier Interactive Patient Education  2019 [employer].

## 2018-06-19 NOTE — Progress Notes (Signed)
Subjective:    Hannah Page is a 23 y.o. female who presents to [facility] today for eczema:  1.  Eczema: Patient concern for eczema flare.  She has a rash scattered across her abdomen and some on her chest as well.  Scaly.  Itching at times.  She has tried over-the-counter eczema cream which is helped somewhat.  No URI symptoms.  No other skin lesions.  2.  Chest pain: Patient is noted some sharp stabbing pain along left sternal border for the past several weeks.  She continues to dance for exercise but did have a break for about a month and just restarted about 2 to 3 weeks ago.  Worse if she is stretching her arms or doing any heavy lifting.  Otherwise she does not have pain with any exertion or dancing.  No dyspnea.  She has longstanding palpitations but these have been mild to nonexistent over the past several months.  3.  Inconsistent periods: Patient had surgical abortion in February of this year which was about 3 months ago.  She had heavy bleeding during that time her regular menses back in March.  She has not had any menses since that time that she has had occasional spotting.  She continues to take her daily progestin contraception.  No abdominal pain.  No breast tenderness.   ROS as above per HPI.   The following portions of the patient's history were reviewed and updated as appropriate: allergies, current medications, past medical history, family and social history, and problem list. Patient is a nonsmoker.    PMH reviewed.  Past Medical History:  Diagnosis Date  . Migraine   . PVCs (premature ventricular contractions)   . 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 ADENOIDECTOMY    . TONSILLECTOMY    . wisdom teeth      Medications reviewed. Current Outpatient Medications  Medication Sig Dispense Refill  . acetaminophen (TYLENOL) 500 MG tablet Take by mouth.    . Adapalene-Benzoyl Peroxide 0.1-2.5 % gel  Apply to affected areas of skin once daily 45 g 0  . CAMILA 0.35 MG tablet TAKE 1 TABLET BY MOUTH EVERY DAY 28 tablet 1  . ibuprofen (ADVIL,MOTRIN) 600 MG tablet Take 1 tablet (600 mg total) by mouth every 8 (eight) hours as needed. 30 tablet 2  . meloxicam (MOBIC) 15 MG tablet Take 1 tablet (15 mg) by mouth with food daily as needed. 30 tablet 0  . promethazine (PHENERGAN) 12.5 MG tablet TAKE 1 TABLET BY MOUTH EVERY 8 HOURS AS NEEDED FOR NAUSEA AND VOMITING 20 tablet 1   No current facility-administered medications for this visit.      Objective:   Physical Exam BP 116/64   Pulse 86   LMP 04/06/2018 (Exact Date)   SpO2 98%  Gen:  Alert, cooperative patient who appears stated age in no acute distress.  Vital signs reviewed. HEENT: EOMI,  MMM Cardiac:  Regular rate and rhythm with grade 2 systolic ejection murmur noted.Kermit Balo S1/S2. Pulm:  Clear to auscultation bilaterally with good air movement.  No wheezes or rales noted.   Skin: 1 cm patches of shiny, scaly patches scattered across abdomen.  No other skin lesions noted.  Impression/plan: 1.  Tinea corporis versus versicolor: -Could also possibly eczema flare she does have history in the past but this looks more to be tinea/fungal infection. -We will treat with Lotrisone -  If no improvement consider switch to ketoconazole for tinea versicolor.  2.  Costochondritis:  -patient is active young female who did take a break during coronavirus care. -She is back to dancing which likely aggravated her pain.-Ibuprofen/anti-inflammatories for relief. -Follow-up if any worsening or change in quality of pain. 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  3.  Irregular periods: -Likely secondary to surgical abortion. -She is continue take oral birth control pills. -Pregnancy test today was negative. -Follow-up with [place] in the next month if periods have not resumed her pattern.

## 2018-06-20 ENCOUNTER — Encounter: Payer: Self-pay | Admitting: Family Medicine

## 2018-08-02 ENCOUNTER — Telehealth: Payer: Self-pay | Admitting: *Deleted

## 2018-08-02 DIAGNOSIS — L709 Acne, unspecified: Secondary | ICD-10-CM

## 2018-08-02 NOTE — Telephone Encounter (Signed)
Pt is requesting a referral for her skin.  Will forward to new PCP. Christen Bame, CMA

## 2018-08-07 ENCOUNTER — Other Ambulatory Visit: Payer: Self-pay

## 2018-08-08 MED ORDER — NORETHINDRONE 0.35 MG PO TABS: 1.0000 | ORAL_TABLET | Freq: Every day | ORAL | 1 refills | Status: DC

## 2018-08-10 NOTE — Telephone Encounter (Signed)
Attempted to reach patient to discuss. No answer. Voicemail full. Would like to hear more about why patient needs referral before placing. Will wait to see if patient calls back.  Leeanne Rio, MD

## 2018-08-15 NOTE — Telephone Encounter (Signed)
Patients mother returns PCP phone call. Mom stated she would like to be seen by a dermatologist for eczema and acne.

## 2018-08-17 NOTE — Addendum Note (Signed)
Addended by: Owens Shark, Colby Catanese on: 08/17/2018 03:18 PM   Modules accepted: Orders

## 2018-08-17 NOTE — Telephone Encounter (Signed)
Informed patient of referral.  .Ozella Almond, [place]

## 2018-08-17 NOTE — Telephone Encounter (Signed)
Referral placed, please let patient know.  Dorris Singh, MD  Family Medicine Teaching Service

## 2018-08-23 ENCOUNTER — Telehealth: Payer: Self-pay | Admitting: Family Medicine

## 2018-08-23 NOTE — Telephone Encounter (Signed)
Form was not in [employer].  Looked in other team folders and could not locate it.  Hannah Page, [place]

## 2018-08-23 NOTE — Telephone Encounter (Signed)
[employer]  form dropped off for at front desk for completion.  Verified that patient section of form has been completed.  Last DOS/WCC with PCP was 06/19/18   Placed form in team folder to be completed by clinical staff.  Grayce Corie Chiquito

## 2018-08-24 NOTE — Telephone Encounter (Signed)
Mom called back.  The number to call when ready for pickup is [phone].  She was not sure she put that on the paperwork. Christen Bame, CMA

## 2018-08-24 NOTE — Telephone Encounter (Signed)
[facility] RN team, do you know where this form is located? It is not in my inbox and per Shelly's note, she could not locate it in our team folder.  Thanks Leeanne Rio, MD

## 2018-08-27 NOTE — Telephone Encounter (Signed)
Another form was found but it was determined that it needed to be completed by someone else. Christen Bame, CMA

## 2018-10-08 ENCOUNTER — Other Ambulatory Visit: Payer: Self-pay | Admitting: Family Medicine

## 2018-10-09 NOTE — Telephone Encounter (Signed)
Please ask patient to schedule appointment to meet me. She is due for a pap smear Thanks Leeanne Rio, MD

## 2018-10-10 ENCOUNTER — Ambulatory Visit: Payer: Medicaid Other | Admitting: Family Medicine

## 2018-10-12 ENCOUNTER — Ambulatory Visit (HOSPITAL_COMMUNITY)
Admission: RE | Admit: 2018-10-12 | Discharge: 2018-10-12 | Disposition: A | Discharge status: Home / Self Care | Payer: Medicaid Other | Source: Ambulatory Visit | Attending: Family Medicine | Admitting: Family Medicine

## 2018-10-12 ENCOUNTER — Ambulatory Visit (INDEPENDENT_AMBULATORY_CARE_PROVIDER_SITE_OTHER): Payer: Medicaid Other | Admitting: Family Medicine

## 2018-10-12 ENCOUNTER — Other Ambulatory Visit (HOSPITAL_COMMUNITY)
Admission: RE | Admit: 2018-10-12 | Discharge: 2018-10-12 | Disposition: A | Discharge status: Home / Self Care | Payer: Medicaid Other | Source: Ambulatory Visit | Attending: Family Medicine | Admitting: Family Medicine

## 2018-10-12 ENCOUNTER — Other Ambulatory Visit: Payer: Self-pay

## 2018-10-12 ENCOUNTER — Encounter: Payer: Self-pay | Admitting: Family Medicine

## 2018-10-12 VITALS — BP 120/72 | HR 95

## 2018-10-12 DIAGNOSIS — Z23 Encounter for immunization: Secondary | ICD-10-CM

## 2018-10-12 DIAGNOSIS — R079 Chest pain, unspecified: Secondary | ICD-10-CM | POA: Insufficient documentation

## 2018-10-12 DIAGNOSIS — R9431 Abnormal electrocardiogram [ECG] [EKG]: Secondary | ICD-10-CM | POA: Insufficient documentation

## 2018-10-12 DIAGNOSIS — R8290 Unspecified abnormal findings in urine: Secondary | ICD-10-CM | POA: Diagnosis not present

## 2018-10-12 DIAGNOSIS — Z124 Encounter for screening for malignant neoplasm of cervix: Secondary | ICD-10-CM | POA: Insufficient documentation

## 2018-10-12 DIAGNOSIS — N912 Amenorrhea, unspecified: Secondary | ICD-10-CM | POA: Diagnosis not present

## 2018-10-12 LAB — POCT UA - MICROSCOPIC ONLY: Epithelial cells, urine per micros: >20

## 2018-10-12 LAB — POCT WET PREP (WET MOUNT)
Clue Cells Wet Prep Whiff POC: NEGATIVE
Trichomonas Wet Prep HPF POC: ABSENT

## 2018-10-12 LAB — POCT URINALYSIS DIP (MANUAL ENTRY)
Bilirubin, UA: NEGATIVE
Glucose, UA: NEGATIVE mg/dL
Ketones, POC UA: NEGATIVE mg/dL
Nitrite, UA: NEGATIVE
Protein Ur, POC: NEGATIVE mg/dL
Spec Grav, UA: >=1.03 — AB (ref 1.010–1.025)
Urobilinogen, UA: 1 E.U./dL
pH, UA: 6 (ref 5.0–8.0)

## 2018-10-12 LAB — POCT URINE PREGNANCY: Preg Test, Ur: NEGATIVE

## 2018-10-12 NOTE — Progress Notes (Signed)
Date of Visit: 10/12/2018   HPI:  Patient presents to discuss several issues.  Urine - over the weekend got drunk and then Monday urine started to smell abnormal. Is urinating somewhat more than normal. No dysuria. No unusual back pain, no fevers. Having some brownish discharge. LMP 8/9. Taking norethindrone for birth control. Sexually active, 2 female partners in the last year, uses condoms but not every time. Agreeable to STD testing today. No pelvic pain.  Chest pain - ongoing for about 2 months. Is in the center of her chest. Unrelated to exertion, notices it when she stretches or takes a big breath. Happening daily, about 2-3 times per day and lasts just a second at a time. She is not able to reproduce the pain with palpation. No associated shortness of breath or nausea. Previously was followed by pediatric cardiology for frequent PVCs. Is a Public house manager.  ROS: See HPI.  [place]: history of migraines, eczema  PHYSICAL EXAM: BP 120/72   Pulse 95   LMP 09/02/2018   SpO2 99%  Gen: no acute distress, pleasant, cooperative, well appearing HEENT: normocephalic, atraumatic  Heart: regular rate and rhythm, +systolic murmur loudest at left upper sternal border. Chest pain not reproducible with palpation, is with stretching. Lungs: clear to auscultation bilaterally, normal work of breathing  Neuro: alert, speech normal GU: normal appearing external genitalia without lesions. Vagina is moist with white discharge. Cervix normal in appearance. No cervical motion tenderness or tenderness on bimanual exam. No adnexal masses.   ASSESSMENT/PLAN:  Health maintenance:  -pap smear done today -flu shot given today, attempted to give Tdap but we did not have any Tdap vaccines in stock  Urine odor Etiology unclear, new. UA not really consistent with UTI - urine micro remarkable for many squams (dirty catch) and some bacteria but minimal white cells. Symptoms not highly consistent with UTI either.  Discussed option of empiric tx for UTI versus sending urine cx, and patient preferred urine cx. Will also check gc/chlamydia, wet prep.  Chest pain  Description is more consistent with musculoskeletal pain, though was not reproducible on palpation. Doubt PE given stability of vitals, intermittent nature, onset with stretching. EKG performed today and on my read is unchanged from prior. Given her history of previously following with cardiology as a teenager for frequent PVC burden, will refer to adult cardiology for assessment. Patient agreeable.  FOLLOW UP: Follow up as needed if symptoms worsen or do not improve. Referring to cardiology as above  [place] J. Ardelia Mems, [place]

## 2018-10-12 NOTE — Patient Instructions (Addendum)
Sending some tests today Will call you with results  EKG looks ok. I don't think the pain is related to your heart. If it worsens or changes please let me know. Referring to cardiologist to be seen.  Flu shot and tetanus shot today  Be well, Dr. Ardelia Mems

## 2018-10-14 ENCOUNTER — Other Ambulatory Visit: Payer: Self-pay | Admitting: Family Medicine

## 2018-10-14 LAB — URINE CULTURE

## 2018-10-15 ENCOUNTER — Telehealth: Payer: Self-pay | Admitting: Family Medicine

## 2018-10-15 MED ORDER — CEPHALEXIN 500 MG PO CAPS: 500.0000 mg | ORAL_CAPSULE | Freq: Two times a day (BID) | ORAL | 0 refills | Status: DC

## 2018-10-15 NOTE — Telephone Encounter (Signed)
Called patient & advised of + urine cx Treat with keflex Somehow does not seem her gc/chlamydia has been run - I have [employer] about getting this added to her pap  Leeanne Rio, MD

## 2018-10-16 LAB — CERVICOVAGINAL ANCILLARY ONLY
Chlamydia: NEGATIVE
Neisseria Gonorrhea: NEGATIVE
Trichomonas: NEGATIVE

## 2018-10-17 LAB — CYTOLOGY - PAP: Diagnosis: NEGATIVE

## 2018-10-18 ENCOUNTER — Telehealth: Payer: Self-pay

## 2018-10-18 NOTE — Telephone Encounter (Signed)
Patient was informed of negative results.  Ozella Almond, CMA'

## 2018-12-04 ENCOUNTER — Other Ambulatory Visit: Payer: Self-pay | Admitting: Family Medicine

## 2019-03-25 ENCOUNTER — Encounter: Payer: Medicaid Other | Admitting: Family Medicine

## 2019-03-26 ENCOUNTER — Encounter: Payer: Self-pay | Admitting: Family Medicine

## 2019-03-26 ENCOUNTER — Other Ambulatory Visit: Payer: Self-pay

## 2019-03-26 ENCOUNTER — Other Ambulatory Visit (HOSPITAL_COMMUNITY)
Admission: RE | Admit: 2019-03-26 | Discharge: 2019-03-26 | Disposition: A | Discharge status: Home / Self Care | Payer: Medicaid Other | Source: Ambulatory Visit | Attending: Family Medicine | Admitting: Family Medicine

## 2019-03-26 ENCOUNTER — Ambulatory Visit (INDEPENDENT_AMBULATORY_CARE_PROVIDER_SITE_OTHER): Payer: Medicaid Other | Admitting: Family Medicine

## 2019-03-26 VITALS — BP 102/65 | HR 63 | Wt 144.0 lb

## 2019-03-26 DIAGNOSIS — Z13220 Encounter for screening for lipoid disorders: Secondary | ICD-10-CM | POA: Diagnosis not present

## 2019-03-26 DIAGNOSIS — Z113 Encounter for screening for infections with a predominantly sexual mode of transmission: Secondary | ICD-10-CM | POA: Diagnosis not present

## 2019-03-26 DIAGNOSIS — Z23 Encounter for immunization: Secondary | ICD-10-CM

## 2019-03-26 NOTE — Patient Instructions (Addendum)
Will call or send a letter with labs  Completed form for [facility]  Next visit in 1 year, sooner if needed  Be well, Dr. Ardelia Mems    Health Maintenance, Female Adopting a healthy lifestyle and getting preventive care are important in promoting health and wellness. Ask your health care provider about:  The right schedule for you to have regular tests and exams.  Things you can do on your own to prevent diseases and keep yourself healthy. What should I know about diet, weight, and exercise? Eat a healthy diet   Eat a diet that includes plenty of vegetables, fruits, low-fat dairy products, and lean protein.  Do not eat a lot of foods that are high in solid fats, added sugars, or sodium. Maintain a healthy weight Body mass index (BMI) is used to identify weight problems. It estimates body fat based on height and weight. Your health care provider can help determine your BMI and help you achieve or maintain a healthy weight. Get regular exercise Get regular exercise. This is one of the most important things you can do for your health. Most adults should:  Exercise for at least 150 minutes each week. The exercise should increase your heart rate and make you sweat (moderate-intensity exercise).  Do strengthening exercises at least twice a week. This is in addition to the moderate-intensity exercise.  Spend less time sitting. Even light physical activity can be beneficial. Watch cholesterol and blood lipids Have your blood tested for lipids and cholesterol at 22 years of age, then have this test every 5 years. Have your cholesterol levels checked more often if:  Your lipid or cholesterol levels are high.  You are older than 22 years of age.  You are at high risk for heart disease. What should I know about cancer screening? Depending on your health history and family history, you may need to have cancer screening at various ages. This may include screening for:  Breast  cancer.  Cervical cancer.  Colorectal cancer.  Skin cancer.  Lung cancer. What should I know about heart disease, diabetes, and high blood pressure? Blood pressure and heart disease  High blood pressure causes heart disease and increases the risk of stroke. This is more likely to develop in people who have high blood pressure readings, are of African descent, or are overweight.  Have your blood pressure checked: ? Every 3-5 years if you are 30-33 years of age. ? Every year if you are 48 years old or older. Diabetes Have regular diabetes screenings. This checks your fasting blood sugar level. Have the screening done:  Once every three years after age 85 if you are at a normal weight and have a low risk for diabetes.  More often and at a younger age if you are overweight or have a high risk for diabetes. What should I know about preventing infection? Hepatitis B If you have a higher risk for hepatitis B, you should be screened for this virus. Talk with your health care provider to find out if you are at risk for hepatitis B infection. Hepatitis C Testing is recommended for:  Everyone born from [birth year] through 1963.  Anyone with known risk factors for hepatitis C. Sexually transmitted infections (STIs)  Get screened for STIs, including gonorrhea and chlamydia, if: ? You are sexually active and are younger than 22 years of age. ? You are older than 22 years of age and your health care provider tells you that you are at risk for this  type of infection. ? Your sexual activity has changed since you were last screened, and you are at increased risk for chlamydia or gonorrhea. Ask your health care provider if you are at risk.  Ask your health care provider about whether you are at high risk for HIV. Your health care provider may recommend a prescription medicine to help prevent HIV infection. If you choose to take medicine to prevent HIV, you should first get tested for HIV. You should  then be tested every 3 months for as long as you are taking the medicine. Pregnancy  If you are about to stop having your period (premenopausal) and you may become pregnant, seek counseling before you get pregnant.  Take 400 to 800 micrograms (mcg) of folic acid every day if you become pregnant.  Ask for birth control (contraception) if you want to prevent pregnancy. Osteoporosis and menopause Osteoporosis is a disease in which the bones lose minerals and strength with aging. This can result in bone fractures. If you are 69 years old or older, or if you are at risk for osteoporosis and fractures, ask your health care provider if you should:  Be screened for bone loss.  Take a calcium or vitamin D supplement to lower your risk of fractures.  Be given hormone replacement therapy (HRT) to treat symptoms of menopause. Follow these instructions at home: Lifestyle  Do not use any products that contain nicotine or tobacco, such as cigarettes, e-cigarettes, and chewing tobacco. If you need help quitting, ask your health care provider.  Do not use street drugs.  Do not share needles.  Ask your health care provider for help if you need support or information about quitting drugs. Alcohol use  Do not drink alcohol if: ? Your health care provider tells you not to drink. ? You are pregnant, may be pregnant, or are planning to become pregnant.  If you drink alcohol: ? Limit how much you use to 0-1 drink a day. ? Limit intake if you are breastfeeding.  Be aware of how much alcohol is in your drink. In the [place] one drink equals one 12 oz bottle of beer (355 mL), one 5 oz glass of wine (148 mL), or one 1 oz glass of hard liquor (44 mL). General instructions  Schedule regular health, dental, and eye exams.  Stay current with your vaccines.  Tell your health care provider if: ? You often feel depressed. ? You have ever been abused or do not feel safe at home. Summary  Adopting a  healthy lifestyle and getting preventive care are important in promoting health and wellness.  Follow your health care provider's instructions about healthy diet, exercising, and getting tested or screened for diseases.  Follow your health care provider's instructions on monitoring your cholesterol and blood pressure. This information is not intended to replace advice given to you by your health care provider. Make sure you discuss any questions you have with your health care provider. Document Revised: 01/03/2018 Document Reviewed: 01/03/2018 Elsevier Patient Education  2020 [employer].

## 2019-03-26 NOTE — Progress Notes (Signed)
Date of Visit: 03/26/2019   HPI:  Patient presents today for a well woman exam.   Concerns today: needs form completed for drivers license Periods: irregular, on norethindrone for contraception (history of migraine with aura) Pelvic symptoms: no pelvic pain or abnormal discharge  Sexual activity: none in last 3 months, only vaginal sex STD Screening: desires today Pap smear status: current Exercise: dances regularly Smoking: no tobacco Alcohol: occasional, 1-2 times a year, 3-4 drinks at a time Drugs: marijuana about twice a weeek Mood: no concerns Dentist: has not been as an adult  Needs form completed for DMV. Had vague episode on 2009 which was described as syncope vs seizure. Has had no seizures since. Not on any medications for seizures. Not followed by neurology.  [place]: grandmother with breast cancer, no first degree relatives with cancer  PHYSICAL EXAM: BP 102/65   Pulse 63   Wt 144 lb (65.3 kg)   LMP 02/26/2019 (Approximate)   SpO2 99%   BMI 23.96 kg/m  Gen: NAD, pleasant, cooperative HEENT: NCAT, PERRL, no palpable thyromegaly or anterior cervical lymphadenopathy Heart: RRR, no murmurs Lungs: CTAB, NWOB Abdomen: soft, nontender to palpation Neuro: grossly nonfocal, speech normal GU: normal appearing external genitalia without lesions. Blind gc/chl swab obtained  ASSESSMENT/PLAN:  Health maintenance:  -STD screening: gc/chlamydia/trich, HIV, RPR obtained today -pap smear: UTD -lipid screening: check lipids & CMET today -immunizations:  . Tdap: given today -handout given on health maintenance topics  Form completion DMV form completed, copy to be scanned in Notably the form was being completed in part due to history of "seizure disorder". Reviewed available records and spoke with patient at length. It appears she had a syncope vs seizure episode around age 52. No subsequent seizures, and has not taken anti seizure medications. Does not follow with neurology.  I don't see any reason to restrict her driving privilidges or even to make her have these forms completed regularly.  FOLLOW UP: Follow up in 1 year for CPE  [place] J. Ardelia Mems, [place]

## 2019-03-27 ENCOUNTER — Telehealth: Payer: Self-pay

## 2019-03-27 LAB — CMP14+EGFR
ALT: 9 IU/L (ref 0–32)
AST: 16 IU/L (ref 0–40)
Albumin/Globulin Ratio: 1.8 (ref 1.2–2.2)
Albumin: 4.8 g/dL (ref 3.9–5.0)
Alkaline Phosphatase: 58 IU/L (ref 39–117)
BUN/Creatinine Ratio: 11 (ref 9–23)
BUN: 9 mg/dL (ref 6–20)
Bilirubin Total: 0.8 mg/dL (ref 0.0–1.2)
CO2: 21 mmol/L (ref 20–29)
Calcium: 9.7 mg/dL (ref 8.7–10.2)
Chloride: 101 mmol/L (ref 96–106)
Creatinine, Ser: 0.8 mg/dL (ref 0.57–1.00)
GFR calc Af Amer: 122 mL/min/{1.73_m2} (ref >59)
GFR calc non Af Amer: 106 mL/min/{1.73_m2} (ref >59)
Globulin, Total: 2.6 g/dL (ref 1.5–4.5)
Glucose: 71 mg/dL (ref 65–99)
Potassium: 3.8 mmol/L (ref 3.5–5.2)
Sodium: 138 mmol/L (ref 134–144)
Total Protein: 7.4 g/dL (ref 6.0–8.5)

## 2019-03-27 LAB — CERVICOVAGINAL ANCILLARY ONLY
Chlamydia: NEGATIVE
Comment: NEGATIVE
Comment: NEGATIVE
Comment: NORMAL
Neisseria Gonorrhea: NEGATIVE
Trichomonas: NEGATIVE

## 2019-03-27 LAB — RPR: RPR Ser Ql: NONREACTIVE

## 2019-03-27 LAB — LIPID PANEL
Chol/HDL Ratio: 2.9 ratio (ref 0.0–4.4)
Cholesterol, Total: 144 mg/dL (ref 100–199)
HDL: 50 mg/dL (ref >39)
LDL Chol Calc (NIH): 85 mg/dL (ref 0–99)
Triglycerides: 35 mg/dL (ref 0–149)
VLDL Cholesterol Cal: 9 mg/dL (ref 5–40)

## 2019-03-27 LAB — HIV ANTIBODY (ROUTINE TESTING W REFLEX): HIV Screen 4th Generation wRfx: NONREACTIVE

## 2019-03-27 NOTE — Telephone Encounter (Signed)
Informed patient of results.  .Hannah Page R Lynelle Weiler, CMA  

## 2019-04-16 ENCOUNTER — Telehealth (INDEPENDENT_AMBULATORY_CARE_PROVIDER_SITE_OTHER): Payer: Medicaid Other | Admitting: Family Medicine

## 2019-04-16 ENCOUNTER — Other Ambulatory Visit: Payer: Self-pay

## 2019-04-16 DIAGNOSIS — N6320 Unspecified lump in the left breast, unspecified quadrant: Secondary | ICD-10-CM

## 2019-04-16 DIAGNOSIS — N63 Unspecified lump in unspecified breast: Secondary | ICD-10-CM

## 2019-04-16 DIAGNOSIS — N6322 Unspecified lump in the left breast, upper inner quadrant: Secondary | ICD-10-CM | POA: Diagnosis not present

## 2019-04-16 NOTE — Progress Notes (Signed)
[place] Telemedicine Visit  Patient consented to have virtual visit. Method of visit: Telephone  Encounter participants: Patient: Hannah Page - located at home in [facility] Provider: Nuala Alpha - located at [facility] Others (if applicable): none  Chief Complaint: Left Breast Mass  HPI:  Hannah Page is a 24y/o female who is requesting a referral back to the breast center. She had a left breast ultrasound performed in 03/07/2018 which showed a hypoechoic mass measuring 1.5 x 0.6 x 1/6 cm located at 2:30 o'clock 5cm from the nipple. The Bi-rads category is 3 noting it is probably benign. Patient states the mass has felt like its gotten bigger and is more painful on palpation. Requests repeat imaging.  ROS: per HPI  Pertinent PMHx: None  Exam:  Respiratory: speaks in full sentences  Assessment/Plan:  Breast mass Left breast mass previously imaged with ultrasound now getting larger and more tender. Patient requests repeat u/s which I think is reasonable. - Referral to breast center for imaging. Repeat left breast U/S    Time spent during visit with patient: >20 minutes  Hannah Rutherford, DO [facility], PGY-3

## 2019-04-17 ENCOUNTER — Telehealth: Payer: Self-pay | Admitting: *Deleted

## 2019-04-17 ENCOUNTER — Other Ambulatory Visit: Payer: Self-pay | Admitting: Family Medicine

## 2019-04-17 NOTE — Telephone Encounter (Signed)
-----   Message from Nuala Alpha, DO sent at 04/16/2019  4:40 PM EDT ----- Regarding: left breast ultrasound She needs the left breast ultrasound scheduled. Order is in epic. Thanks!!!  You da best!!!!  Tim

## 2019-04-17 NOTE — Assessment & Plan Note (Signed)
Left breast mass previously imaged with ultrasound now getting larger and more tender. Patient requests repeat u/s which I think is reasonable. - Referral to breast center for imaging. Repeat left breast U/S

## 2019-04-17 NOTE — Telephone Encounter (Signed)
Pt informed and scheduled. Gave her the number to reschedule the date. Adiva Boettner Kennon Holter, CMA

## 2019-04-23 ENCOUNTER — Other Ambulatory Visit: Payer: Self-pay

## 2019-04-23 ENCOUNTER — Other Ambulatory Visit: Payer: Self-pay | Admitting: Family Medicine

## 2019-04-23 ENCOUNTER — Ambulatory Visit
Admission: RE | Admit: 2019-04-23 | Discharge: 2019-04-23 | Disposition: A | Discharge status: Home / Self Care | Payer: Medicaid Other | Source: Ambulatory Visit | Attending: Family Medicine | Admitting: Family Medicine

## 2019-04-23 ENCOUNTER — Other Ambulatory Visit: Payer: Medicaid Other

## 2019-04-23 DIAGNOSIS — N6320 Unspecified lump in the left breast, unspecified quadrant: Secondary | ICD-10-CM

## 2019-04-23 DIAGNOSIS — N6321 Unspecified lump in the left breast, upper outer quadrant: Secondary | ICD-10-CM | POA: Diagnosis not present

## 2019-05-15 ENCOUNTER — Other Ambulatory Visit: Payer: Medicaid Other

## 2019-05-22 ENCOUNTER — Other Ambulatory Visit: Payer: Self-pay

## 2019-05-24 ENCOUNTER — Telehealth: Payer: Self-pay

## 2019-05-24 NOTE — Telephone Encounter (Signed)
Patient calls nurse line requesting a blood draw for pregnancy. Patient stated she last had intercourse with her husband last week and then a few times in March. Patients husband is stationed elsewhere, and he is about to be deployed. They are eager to know if patient is pregnant. Patient stated her periods are not normal and can not given me her last. I advised patient to take a home test and she stated she did but negative. Patient states, "its because I am early pregnant." Will forward to PCP.

## 2019-05-29 ENCOUNTER — Other Ambulatory Visit: Payer: Self-pay

## 2019-05-29 ENCOUNTER — Ambulatory Visit
Admission: RE | Admit: 2019-05-29 | Discharge: 2019-05-29 | Disposition: A | Discharge status: Home / Self Care | Payer: Medicaid Other | Source: Ambulatory Visit | Attending: Family Medicine | Admitting: Family Medicine

## 2019-05-29 DIAGNOSIS — N6321 Unspecified lump in the left breast, upper outer quadrant: Secondary | ICD-10-CM | POA: Diagnosis not present

## 2019-05-29 DIAGNOSIS — D242 Benign neoplasm of left breast: Secondary | ICD-10-CM | POA: Diagnosis not present

## 2019-05-29 DIAGNOSIS — N6320 Unspecified lump in the left breast, unspecified quadrant: Secondary | ICD-10-CM

## 2019-05-30 NOTE — Telephone Encounter (Signed)
RX addressed by another source.  Hannah Page, [place]

## 2019-05-31 NOTE — Telephone Encounter (Signed)
Returned call to patient to discuss. She states "it's fine now" and that she got her period. She has no further needs at this time. Patient appreciative.  Leeanne Rio, MD

## 2019-06-10 ENCOUNTER — Other Ambulatory Visit: Payer: Self-pay | Admitting: Family Medicine

## 2019-06-10 ENCOUNTER — Other Ambulatory Visit: Payer: Self-pay

## 2019-06-10 ENCOUNTER — Other Ambulatory Visit (HOSPITAL_COMMUNITY)
Admission: RE | Admit: 2019-06-10 | Discharge: 2019-06-10 | Disposition: A | Discharge status: Home / Self Care | Payer: Medicaid Other | Source: Ambulatory Visit | Attending: Family Medicine | Admitting: Family Medicine

## 2019-06-10 ENCOUNTER — Ambulatory Visit (INDEPENDENT_AMBULATORY_CARE_PROVIDER_SITE_OTHER): Payer: Medicaid Other | Admitting: Family Medicine

## 2019-06-10 VITALS — BP 104/62 | HR 82 | Wt 146.0 lb

## 2019-06-10 DIAGNOSIS — N898 Other specified noninflammatory disorders of vagina: Secondary | ICD-10-CM | POA: Diagnosis not present

## 2019-06-10 DIAGNOSIS — B9689 Other specified bacterial agents as the cause of diseases classified elsewhere: Secondary | ICD-10-CM | POA: Diagnosis not present

## 2019-06-10 DIAGNOSIS — N760 Acute vaginitis: Secondary | ICD-10-CM

## 2019-06-10 LAB — POCT WET PREP (WET MOUNT)
Clue Cells Wet Prep Whiff POC: POSITIVE
Trichomonas Wet Prep HPF POC: ABSENT

## 2019-06-10 MED ORDER — METRONIDAZOLE 500 MG PO TABS: 500.0000 mg | ORAL_TABLET | Freq: Two times a day (BID) | ORAL | 0 refills | Status: AC

## 2019-06-10 NOTE — Patient Instructions (Signed)
Thank you for coming to see me today. It was a pleasure! Today we talked about:   We will release your results on MyChart.  Any abnormality we will call you.  Please follow-up if you have any continued abnormal bleeding or sooner as needed.  If you have any questions or concerns, please do not hesitate to call the office at [phone].  Take Care,   [place] Carrell Rahmani, DO

## 2019-06-10 NOTE — Progress Notes (Deleted)
   SUBJECTIVE:   CHIEF COMPLAINT / HPI:   ***: ***  PERTINENT  PMH / PSH: ***  OBJECTIVE:  LMP 06/08/2019 (Exact Date)   General: NAD, pleasant Neck: Supple, no LAD Cardiovascular: RRR, no m/r/g, no LE edema*** Respiratory: CTA BL***, normal work of breathing Gastrointestinal: soft, nontender, nondistended, normoactive BS*** Neuro: CN II-XII grossly intact Psych: AOx3, appropriate affect ***  ASSESSMENT/PLAN:   No problem-specific Assessment & Plan notes found for this encounter.    [place] Godfrey Tritschler, DO PGY-3, [facility]

## 2019-06-10 NOTE — Progress Notes (Signed)
   SUBJECTIVE:   CHIEF COMPLAINT / HPI:   Vaginal Discharge - has been ongoing for a few days  - Denies burning, abdominal pain, nausea or vomiting. Denies burning with urination, hematuria.  -Patient reports that on Thursday she started having an incident on Friday she noticed an odor that started to get worse. She does report that she recently started using organic tampons. She states that the itch has improved a little bit but it still comes and goes. She states she has had this in the past when she has BV. - Patient would like to have STI testing done for GC/chlamydia but is not interested in blood work today.  Jari Pigg active with one female partner.  - LMP was on 06/08/2019, and was normal for patient.  - Patient reports BV, yeast in the past.  - Patient denies douching.    PERTINENT  PMH / PSH: No significant past medical history  OBJECTIVE:  BP 104/62   Pulse 82   Wt 146 lb (66.2 kg)   LMP 06/08/2019 (Exact Date)   SpO2 99%   BMI 24.30 kg/m   General: NAD, pleasant GU/GYN: External genitalia within normal limits.  Vaginal mucosa pink, moist, normal rugae.  Nonfriable cervix without lesions, no discharge, but bleeding is noted on speculum exam (patient on period). Exam performed in the presence of a chaperone.   ASSESSMENT/PLAN:   BV (bacterial vaginosis) confirmed on wet prep. G/C Marveen Reeks is pending. Symptoms consistent with this.  - Treatment with Flagyl 500 BID x 7 days. - F/U if symptoms not improving or getting worse.  - Will f/u on G/C Chlamydia and call in Rx if positive.  - F/U with PCP as needed.  - Return precautions including abdominal pain, fever, chills, nausea, or vomiting given.      [place] Derriona Branscom, DO PGY-3, [facility]

## 2019-06-12 LAB — CERVICOVAGINAL ANCILLARY ONLY
Chlamydia: NEGATIVE
Comment: NEGATIVE
Comment: NORMAL
Neisseria Gonorrhea: NEGATIVE

## 2019-06-14 DIAGNOSIS — B9689 Other specified bacterial agents as the cause of diseases classified elsewhere: Secondary | ICD-10-CM | POA: Insufficient documentation

## 2019-06-14 NOTE — Assessment & Plan Note (Signed)
confirmed on wet prep. G/C Marveen Reeks is pending. Symptoms consistent with this.  - Treatment with Flagyl 500 BID x 7 days. - F/U if symptoms not improving or getting worse.  - Will f/u on G/C Chlamydia and call in Rx if positive.  - F/U with PCP as needed.  - Return precautions including abdominal pain, fever, chills, nausea, or vomiting given.

## 2019-07-12 DIAGNOSIS — D242 Benign neoplasm of left breast: Secondary | ICD-10-CM | POA: Diagnosis not present

## 2019-07-13 ENCOUNTER — Ambulatory Visit: Payer: Self-pay | Admitting: Surgery

## 2019-07-13 DIAGNOSIS — D242 Benign neoplasm of left breast: Secondary | ICD-10-CM

## 2019-07-13 NOTE — H&P (Signed)
History of Present Illness Hannah Page. Mordecai Tindol MD; 07/13/2019 12:53 PM) The patient is a 24 year old female who presents with a breast mass. Referred by Dr. Dorcas Mcmurray for tender left breast mass  This is a 24 year old female who presents with several years of a slowly enlarging mass in the left upper outer quadrant at 2:30 5 cmfn. The mass has begun to cause tenderness. 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 She presents now to discuss excision.  CLINICAL DATA: 24 year old female presenting with new pain at a left breast mass that has been followed since 2017.  EXAM: ULTRASOUND OF THE LEFT BREAST  COMPARISON: Previous exam(s).  FINDINGS: Targeted ultrasound is performed in the left breast at 2:30 o'clock 5 cm from the nipple demonstrating an oval circumscribed hypoechoic mass measuring 1.5 x 0.6 x 1.3 cm, previously measuring 1.5 x 0.6 x 1.6 cm. There is a small amount of peripheral blood flow. This corresponds to the mass and area of pain felt by the patient.  IMPRESSION: Stable benign mass in the left breast at 2:30 o'clock measuring 1.5 cm. 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 Given the new pain, the patient wishes to pursue surgical excision.  RECOMMENDATION: Ultrasound-guided core needle biopsy of the left breast mass in preparation for surgical excision. This will be scheduled at the patient's earliest convenience.  BI-RADS CATEGORY 2: Benign.   Electronically Signed By: Audie Pinto M.D. On: 04/23/2019 14:43   Problem List/Past Medical Rodman Key K. Kella Splinter, MD; 07/13/2019 12:53 PM) Rodman Comp, LEFT (D24.2)  Past Surgical History Darden Palmer, [place]; 07/12/2019 11:17 AM) Oral Surgery  Diagnostic Studies History Darden Palmer, [place]; 07/12/2019 11:17 AM) Colonoscopy never Mammogram never Pap Smear 1-5 years ago  Allergies Rodman Key K. Ryshawn Sanzone, MD; 07/13/2019 12:53 PM) predniSONE *CORTICOSTEROIDS*  Medication History Darden Palmer, RMA; 07/12/2019 11:18 AM) Edmon Crape (0.35MG  Tablet, Oral) Active. Ibuprofen (600MG  Tablet, Oral) Active.  Social History Darden Palmer, [place]; 07/12/2019 11:17 AM) Alcohol use Occasional alcohol use. Caffeine use Carbonated beverages, Coffee. No drug use Tobacco use Current some day smoker.  Family History Darden Palmer, [place]; 07/12/2019 11:17 AM) Alcohol Abuse Father. Depression Mother. Migraine Headache Mother.  Pregnancy / Birth History Darden Palmer, [place]; 07/12/2019 11:17 AM) Age at menarche 40 years. Gravida 1 Irregular periods Para 0  Other Problems Hannah Page. Hannah Allemand, MD; 07/13/2019 12:53 PM) Heart murmur Migraine Headache Seizure Disorder     Review of Systems Darden Palmer RMA; 07/12/2019 11:17 AM) General Not Present- Appetite Loss, Chills, Fatigue, Fever, Night Sweats, Weight Gain and Weight Loss. Skin Not Present- Change in Wart/Mole, Dryness, Hives, Jaundice, New Lesions, Non-Healing Wounds, Rash and Ulcer. HEENT Present- Wears glasses/contact lenses. Not Present- Earache, Hearing Loss, Hoarseness, Nose Bleed, Oral Ulcers, Ringing in the Ears, Seasonal Allergies, Sinus Pain, Sore Throat, Visual Disturbances and Yellow Eyes. Respiratory Present- Snoring. Not Present- Bloody sputum, Chronic Cough, Difficulty Breathing and Wheezing. Breast Present- Breast Mass and Breast Pain. Not Present- Nipple Discharge and Skin Changes. Cardiovascular Not Present- Chest Pain, Difficulty Breathing Lying Down, Leg Cramps, Palpitations, Rapid Heart Rate, Shortness of Breath and Swelling of Extremities. Gastrointestinal Not Present- Abdominal Pain, Bloating, Bloody Stool, Change in Bowel Habits, Chronic diarrhea, Constipation, Difficulty Swallowing, Excessive gas, Gets full quickly at meals, Hemorrhoids, Indigestion, Nausea, Rectal Pain and Vomiting. Female Genitourinary Not Present- Frequency, Nocturia, Painful Urination, Pelvic Pain and Urgency. Musculoskeletal Not  Present- Back Pain, Joint Pain, Joint Stiffness, Muscle Pain, Muscle Weakness and Swelling of Extremities. Neurological Present- Headaches and Seizures.  Not Present- Decreased Memory, Fainting, Numbness, Tingling, Tremor, Trouble walking and Weakness. Psychiatric Not Present- Anxiety, Bipolar, Change in Sleep Pattern, Depression, Fearful and Frequent crying. Endocrine Not Present- Cold Intolerance, Excessive Hunger, Hair Changes, Heat Intolerance, Hot flashes and New Diabetes. Hematology Not Present- Blood Thinners, Easy Bruising, Excessive bleeding, Gland problems, HIV and Persistent Infections.  Vitals Lattie Haw Caldwell RMA; 07/12/2019 11:19 AM) 07/12/2019 11:18 AM Weight: 148.38 lb Height: 64in Body Surface Area: 1.72 m Body Mass Index: 25.47 kg/m  Temp.: 98.18F  Pulse: 77 (Regular)  BP: 110/60(Sitting, Left Arm, Standard)        Physical Exam Rodman Key K. Kyran Connaughton MD; 07/13/2019 12:53 PM)  The physical exam findings are as follows: Note:Constitutional: WDWN in NAD, conversant, no obvious deformities; resting comfortably Eyes: Pupils equal, round; sclera anicteric; moist conjunctiva; no lid lag HENT: Oral mucosa moist; good dentition Neck: No masses palpated, trachea midline; no thyromegaly Lungs: CTA bilaterally; normal respiratory effort Breasts: symmetric; no nipple changes; no axillary lymphadenopathy; dense fibrocystic changes bilaterally; tender in LUOQ, but no distinct mass palpated CV: Regular rate and rhythm; no murmurs; extremities well-perfused with no edema Abd: +bowel sounds, soft, non-tender, no palpable organomegaly; no palpable hernias Musc: Normal gait; no apparent clubbing or cyanosis in extremities Lymphatic: No palpable cervical or axillary lymphadenopathy Skin: Warm, dry; no sign of jaundice Psychiatric - alert and oriented x 4; calm mood and affect    Assessment & Plan Rodman Key K. Anayansi Rundquist MD; 07/12/2019 11:42 AM)  Rodman Comp, LEFT (D24.2)  Current  Plans Schedule for Surgery - Left radioactive seed localized lumpectomy. The surgical procedure has been discussed with the patient. Potential risks, benefits, alternative treatments, and expected outcomes have been explained. All of the patient's questions at this time have been answered. The likelihood of reaching the patient's treatment goal is good. The patient understand the proposed surgical procedure and wishes to proceed.  Hannah Page. Georgette Dover, MD, [facility]  General/ Trauma Surgery   07/13/2019 12:54 PM

## 2019-07-15 ENCOUNTER — Other Ambulatory Visit: Payer: Self-pay | Admitting: Surgery

## 2019-07-15 DIAGNOSIS — D242 Benign neoplasm of left breast: Secondary | ICD-10-CM

## 2019-07-22 ENCOUNTER — Other Ambulatory Visit: Payer: Self-pay

## 2019-07-22 NOTE — Telephone Encounter (Signed)
Patient calls nurse line to report symptoms of BV. Patient states that she has been having thick malodorous discharge. Patient was seen in the office for same issue on 06/10/2019. [place] come into the office and is requesting medication be sent into pharmacy.   [facility] [address], [place] [address] if rx can be sent in.   To PCP  Talbot Grumbling, RN

## 2019-07-22 NOTE — Telephone Encounter (Signed)
I am not sure that I can prescribe this medication for her, since she is out of state and I don't have a [place] medical license. Please ask patient to seek care there in [facility]. If she wants it sent in to an [place], I can do that.  Thanks, Leeanne Rio, MD

## 2019-07-23 MED ORDER — METRONIDAZOLE 500 MG PO TABS
500.0000 mg | ORAL_TABLET | Freq: Two times a day (BID) | ORAL | 0 refills | Status: DC
Start: 2019-07-23 — End: 2019-12-31

## 2019-07-23 NOTE — Telephone Encounter (Signed)
Patient returns call to nurse line. I advised patient that PCP can not send medications out of state. Patient reports her mother is coming down for a visit soon, and patient is requesting medication be sent to local pharmacy and her mother will bring down. Please advise.  [facility] [address].

## 2019-07-23 NOTE — Telephone Encounter (Signed)
Rx sent in Please advise patient no alcohol while on this medication Thanks Leeanne Rio, MD

## 2019-07-23 NOTE — Telephone Encounter (Signed)
Attempted to call no answer or voicemail set up. Will try again tomorrow. Jolan Mealor Kennon Holter, CMA

## 2019-07-24 NOTE — Telephone Encounter (Signed)
Pt informed and appreciative. Christen Bame, CMA

## 2019-08-13 ENCOUNTER — Telehealth: Payer: Self-pay

## 2019-08-13 NOTE — Telephone Encounter (Signed)
Patients mother calls nurse line reporting the patients breast surgery has been reschedule for September, due to insurance issues. The patient will be living "part time" in [place], [place] by September. Mother is curious how to proceed with surgery. Is this something we can re refer her for down there? Mother states the breast center was not much help. Mother did mention at end of the phone call she has [employer], unsure if this would speed process up here? Will send to referral coordinator for insight.

## 2019-08-14 NOTE — Telephone Encounter (Signed)
Spoke with mother and patient.  She is recently married is moving to [place] for graduate school.  Patient will be filing her [employer] for this surgery.  I advised mother and patient that it would easier if she could call tricare and give them her address in [place].  They should be able to provide her a list of surgeons in the area, that accept her insurance.  Patient voiced understanding and will let me know the names of these offices for me to reach out to them to make an appointment for her and get her records sent there.  Patient will also come by later today to sign a release of information, so we can send notes to this office once it has been established.    Will send to pcp as an FYI.    Bryttani Blew,CMA

## 2019-08-17 ENCOUNTER — Other Ambulatory Visit (HOSPITAL_COMMUNITY): Payer: Medicaid Other

## 2019-08-20 ENCOUNTER — Other Ambulatory Visit: Payer: Self-pay

## 2019-08-21 ENCOUNTER — Ambulatory Visit (HOSPITAL_BASED_OUTPATIENT_CLINIC_OR_DEPARTMENT_OTHER): Admit: 2019-08-21 | Payer: Medicaid Other | Admitting: Surgery

## 2019-08-21 ENCOUNTER — Encounter (HOSPITAL_BASED_OUTPATIENT_CLINIC_OR_DEPARTMENT_OTHER): Payer: Self-pay

## 2019-08-21 SURGERY — BREAST LUMPECTOMY WITH RADIOACTIVE SEED LOCALIZATION
Anesthesia: General | Site: Breast | Laterality: Left

## 2019-08-22 NOTE — Telephone Encounter (Signed)
Patient's mother calls nurse line to check status of rx refills.   Talbot Grumbling, RN

## 2019-08-26 MED ORDER — IBUPROFEN 600 MG PO TABS: 600.0000 mg | ORAL_TABLET | Freq: Three times a day (TID) | ORAL | 1 refills | Status: DC | PRN

## 2019-08-26 NOTE — Telephone Encounter (Signed)
Will refill ibuprofen. Phenergan not currently on her medication list (and is actually listed as an allergy) so I will not refill at this time. Patient should contact office to explain why she needs phenergan if she requires this fill  Thanks Leeanne Rio, MD

## 2019-08-28 ENCOUNTER — Telehealth: Payer: Self-pay | Admitting: Family Medicine

## 2019-08-28 NOTE — Telephone Encounter (Signed)
Patients mother calls nurse line upset Phenergan was denied. Mother advised this is listed as an allergy as of 2017. Mother reports she does not recall Phenergan being an allergy, she confirms "only" Prednisone being an active allergy. Mother reports Dr. Mingo Amber gave this to her "all the time." Patient uses for migraines. Please advise.

## 2019-08-28 NOTE — Telephone Encounter (Signed)
Mother submitted General Education Immunization Form to be completed.  Last appt 06/10/19; Form placed in red team folder.  Note: Immunization record was provided to the mother who still wanted the form submitted

## 2019-08-28 NOTE — Telephone Encounter (Signed)
Reviewed and placed in PCP's box for completion.  .Semira Stoltzfus R Chales Pelissier, CMA  

## 2019-08-29 MED ORDER — PROMETHAZINE HCL 12.5 MG PO TABS
12.5000 mg | ORAL_TABLET | Freq: Three times a day (TID) | ORAL | 0 refills | Status: DC | PRN
Start: 2019-08-29 — End: 2020-04-22

## 2019-08-29 NOTE — Addendum Note (Signed)
Addended by: Leeanne Rio on: 08/29/2019 05:43 PM   Modules accepted: Orders

## 2019-08-29 NOTE — Telephone Encounter (Signed)
Patient's mother returns call to nurse line regarding rx refill for phenergan. See below message from Page.   Mother also requesting form by tomorrow. Informed mother of 7 day turn around policy on forms. Mother verbalized understanding.   Will route to PCP  Talbot Grumbling, RN

## 2019-08-29 NOTE — Telephone Encounter (Signed)
Called and spoke with patient  She confirms she is not allergic to phenergan, has taken it recently without problems. Uses it for migraines. Refilled, and removed from allergy list.  Leeanne Rio, MD

## 2019-08-30 NOTE — Telephone Encounter (Signed)
Immunization form filled out and signed. Will return to [facility] RN team  Leeanne Rio, MD

## 2019-08-30 NOTE — Telephone Encounter (Signed)
Called patient and informed that form is ready for pick up. Attached immunization record. Copy made and placed in batch scanning. Original placed at front desk for pick up.   Talbot Grumbling, RN

## 2019-11-09 ENCOUNTER — Other Ambulatory Visit: Payer: Self-pay | Admitting: Family Medicine

## 2019-11-15 DIAGNOSIS — Z20822 Contact with and (suspected) exposure to covid-19: Secondary | ICD-10-CM | POA: Diagnosis not present

## 2019-12-15 ENCOUNTER — Encounter (HOSPITAL_COMMUNITY): Payer: Self-pay | Admitting: Emergency Medicine

## 2019-12-15 ENCOUNTER — Other Ambulatory Visit: Payer: Self-pay

## 2019-12-15 ENCOUNTER — Emergency Department (HOSPITAL_COMMUNITY)
Admission: EM | Admit: 2019-12-15 | Discharge: 2019-12-15 | Disposition: A | Discharge status: Home / Self Care | Payer: Medicaid Other | Attending: Emergency Medicine | Admitting: Emergency Medicine

## 2019-12-15 DIAGNOSIS — N898 Other specified noninflammatory disorders of vagina: Secondary | ICD-10-CM | POA: Diagnosis not present

## 2019-12-15 DIAGNOSIS — L292 Pruritus vulvae: Secondary | ICD-10-CM | POA: Diagnosis present

## 2019-12-15 LAB — URINALYSIS, ROUTINE W REFLEX MICROSCOPIC
Bacteria, UA: NONE SEEN
Bilirubin Urine: NEGATIVE
Glucose, UA: NEGATIVE mg/dL
Ketones, ur: NEGATIVE mg/dL
Nitrite: NEGATIVE
Protein, ur: NEGATIVE mg/dL
Specific Gravity, Urine: 1.02 (ref 1.005–1.030)
pH: 7 (ref 5.0–8.0)

## 2019-12-15 LAB — WET PREP, GENITAL
Clue Cells Wet Prep HPF POC: NONE SEEN
Sperm: NONE SEEN
Trich, Wet Prep: NONE SEEN
Yeast Wet Prep HPF POC: NONE SEEN

## 2019-12-15 LAB — POC URINE PREG, ED: Preg Test, Ur: NEGATIVE

## 2019-12-15 NOTE — ED Provider Notes (Signed)
[facility] DEPT Provider Note   CSN: [id number] Arrival date & time: 12/15/19  1740     History Chief Complaint  Patient presents with  . Vaginal Itching    Hannah Page is a 24 y.o. female.  The history is provided by the patient and medical records.  Vaginal Itching   Hannah Page is a 24 y.o. female who presents to the Emergency Department complaining of vaginally itching in odor. She presents the emergency department complaining of foul-smelling vaginally odor and itching that started today. She has a history of similar symptoms with BV infection. She denies any abdominal pain, discharge, dysuria. She recently finished her menstrual cycle. She does not believe she is pregnant. She is sexually active. She has a remote history of chlamydia, but this does not feel similar.    Past Medical History:  Diagnosis Date  . Migraine   . PVCs (premature ventricular contractions)   . 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 BV (bacterial vaginosis) 06/14/2019  . Positive pregnancy test 02/21/2018  . Breast mass 02/22/2017  . Contraception 02/27/2015  . Right knee pain 01/29/2013  . Ankle pain 02/01/2012  . Tinea versicolor 09/29/2010  . Eczema 04/04/2010  . Acne 04/01/2010  . TENDINITIS, PATELLAR 02/17/2010  . Migraine headache 12/02/2008    Past Surgical History:  Procedure Laterality Date  . ADENOIDECTOMY    . TONSILLECTOMY    . wisdom teeth       OB History   No obstetric history on file.     Family History  Problem Relation Age of Onset  . Arthritis Mother     Social History   Tobacco Use  . Smoking status: Never Smoker  . Smokeless tobacco: Never Used  Vaping Use  . Vaping Use: Never used  Substance Use Topics  . Alcohol use: No  . Drug use: No    Home Medications Prior to Admission medications   Medication Sig  Start Date End Date Taking? Authorizing Provider  acetaminophen (TYLENOL) 500 MG tablet Take by mouth.    [provider]  ibuprofen (ADVIL) 600 MG tablet Take 1 tablet (600 mg total) by mouth every 8 (eight) hours as needed. 08/26/19   Leeanne Rio, MD  metroNIDAZOLE (FLAGYL) 500 MG tablet Take 1 tablet (500 mg total) by mouth 2 (two) times daily. For 7 days. Do not drink alcohol while taking this medication. 07/23/19   Leeanne Rio, MD  norethindrone (MICRONOR) 0.35 MG tablet TAKE 1 TABLET BY MOUTH EVERY DAY 11/12/19   Leeanne Rio, MD  promethazine (PHENERGAN) 12.5 MG tablet Take 1 tablet (12.5 mg total) by mouth every 8 (eight) hours as needed for nausea or vomiting. 08/29/19   Leeanne Rio, MD    Allergies    Prednisone  Review of Systems   Review of Systems  All other systems reviewed and are negative.   Physical Exam Updated Vital Signs BP 128/77 (BP Location: Right Arm)   Pulse 60   Temp 98 F (36.7 C) (Oral)   Resp 16   Ht 5\' 5"  (1.651 m)   SpO2 99%   BMI 24.30 kg/m   Physical Exam Vitals and nursing note reviewed.  Constitutional:      Appearance: She is well-developed.  HENT:     Head: Normocephalic and atraumatic.  Cardiovascular:     Rate  and Rhythm: Normal rate and regular rhythm.  Pulmonary:     Effort: Pulmonary effort is normal.     Breath sounds: Normal breath sounds.  Abdominal:     Palpations: Abdomen is soft.     Tenderness: There is no abdominal tenderness. There is no guarding or rebound.  Genitourinary:    Comments: Scant vaginal discharge. No CMT or adnexal tenderness. Musculoskeletal:        General: No tenderness.  Skin:    General: Skin is warm and dry.  Neurological:     Mental Status: She is alert and oriented to person, place, and time.  Psychiatric:        Behavior: Behavior normal.     ED Results / Procedures / Treatments   Labs (all labs ordered are listed, but only abnormal results are  displayed) Labs Reviewed  WET PREP, GENITAL - Abnormal; Notable for the following components:      Result Value   WBC, Wet Prep HPF POC FEW (*)    All other components within normal limits  URINALYSIS, ROUTINE W REFLEX MICROSCOPIC - Abnormal; Notable for the following components:   Hgb urine dipstick SMALL (*)    Leukocytes,Ua TRACE (*)    All other components within normal limits  POC URINE PREG, ED  GC/CHLAMYDIA PROBE AMP ([facility] [place]) NOT AT [facility]    EKG None  Radiology No results found.  Procedures Procedures (including critical care time)  Medications Ordered in ED Medications - No data to display  ED Course  I have reviewed the triage vital signs and the nursing notes.  Pertinent labs & imaging results that were available during my care of the patient were reviewed by me and considered in my medical decision making (see chart for details).    MDM Rules/Calculators/A&P                         patient here for evaluation of vaginal odor and itching that started today. No significant discharge on examination. No evidence of acute bacterial infection, BV, PID, tubovarian abscess. Discussed with patient home care for vaginally irritation. Discussed outpatient follow-up.  Final Clinical Impression(s) / ED Diagnoses Final diagnoses:  Vaginal odor    Rx / DC Orders ED Discharge Orders    None       Quintella Reichert, MD 12/15/19 2037

## 2019-12-15 NOTE — ED Triage Notes (Signed)
Patient here from home reporting vaginal odor that started today. Itching, no abnormal discharge.

## 2019-12-16 ENCOUNTER — Telehealth: Payer: Self-pay

## 2019-12-16 LAB — GC/CHLAMYDIA PROBE AMP (~~LOC~~) NOT AT ARMC
Chlamydia: NEGATIVE
Comment: NEGATIVE
Comment: NORMAL
Neisseria Gonorrhea: NEGATIVE

## 2019-12-16 NOTE — Telephone Encounter (Signed)
Transition Care Management Follow-up Telephone Call  Date of discharge and from where: 12/15/2019 [facility]  How have you been since you were released from the hospital? Feeling just fine.   Any questions or concerns? No  Items Reviewed:  Did the pt receive and understand the discharge instructions provided? Yes   Medications obtained and verified? No   Other? No   Any new allergies since your discharge? No   Dietary orders reviewed? Yes  Do you have support at home? Yes   Home Care and Equipment/Supplies: Were home health services ordered? not applicable If so, what is the name of the agency? na  Has the agency set up a time to come to the patient's home? not applicable Were any new equipment or medical supplies ordered?  No What is the name of the medical supply agency? na Were you able to get the supplies/equipment? not applicable Do you have any questions related to the use of the equipment or supplies? No  Functional Questionnaire: (I = Independent and D = Dependent) ADLs: I  Bathing/Dressing- I  Meal Prep- I  Eating- I  Maintaining continence- I  Transferring/Ambulation- I  Managing Meds- I  Follow up appointments reviewed:   PCP Hospital f/u appt confirmed? Yes  Scheduled to see [place] J. Ardelia Mems, MD on 12/31/2019 @ 10:30am  Specialist Hospital f/u appt confirmed? No  Patient stated she wanted to see PCP first.   Are transportation arrangements needed? No   If their condition worsens, is the pt aware to call PCP or go to the Emergency Dept.? Yes  Was the patient provided with contact information for the PCP's office or ED? Yes  Was to pt encouraged to call back with questions or concerns? Yes

## 2019-12-18 ENCOUNTER — Ambulatory Visit: Payer: Medicaid Other | Admitting: Family Medicine

## 2019-12-31 ENCOUNTER — Encounter: Payer: Self-pay | Admitting: Family Medicine

## 2019-12-31 ENCOUNTER — Ambulatory Visit (INDEPENDENT_AMBULATORY_CARE_PROVIDER_SITE_OTHER): Payer: Medicaid Other | Admitting: Family Medicine

## 2019-12-31 ENCOUNTER — Other Ambulatory Visit: Payer: Self-pay

## 2019-12-31 VITALS — BP 100/60 | HR 75 | Ht 64.0 in | Wt 144.2 lb

## 2019-12-31 DIAGNOSIS — Z1159 Encounter for screening for other viral diseases: Secondary | ICD-10-CM | POA: Diagnosis not present

## 2019-12-31 DIAGNOSIS — Z113 Encounter for screening for infections with a predominantly sexual mode of transmission: Secondary | ICD-10-CM | POA: Diagnosis not present

## 2019-12-31 MED ORDER — FLUCONAZOLE 150 MG PO TABS: 150.0000 mg | ORAL_TABLET | Freq: Once | ORAL | 0 refills | Status: AC

## 2019-12-31 NOTE — Patient Instructions (Signed)
It was great to see you again today!  Sent in diflucan for you to take for yeast infection Follow up if not improving - will do pelvic exam at that visit  Checking hepatitis C, HIV, and syphilis tests today  See handout on COVID vaccine. You can also look at: [email or web address]  Be well, Dr. Ardelia Mems

## 2019-12-31 NOTE — Progress Notes (Signed)
    SUBJECTIVE:   CHIEF COMPLAINT / HPI:  Vaginal Itching: Seen in ED on 11/21 for vaginal odor, discharge, and itching. Pelvic exam in the ED was unremarkable. Negative for BV, GC/Chlamydia and had wet prep significant only for a few WBCs at that time. Since then the discharge and odor have abated but the itching has persisted. She says that the itching sensation is primarily internal. She has been treated for a yeast infection in the past but can't say whether this feels similar. Denies any dysuria, dyspareunia, discharge, or bleeding. Has a new partner in the last year, has not been tested for HIV or syphilis since then but would like this today.  Health Maintenance: Not interested in flu or COVID vaccine at this time. Interested in learning more about COVID vaccine and speaking with trusted relatives before making a decision.    OBJECTIVE:   BP 100/60   Pulse 75   Ht 5\' 4"  (1.626 m)   Wt 144 lb 3.2 oz (65.4 kg)   SpO2 98%   BMI 24.75 kg/m   Well-appearing. GU exam deferred after shared decision making  ASSESSMENT/PLAN:   Vaginal Itching: Persistent itching is most likely indicative of a yeast infection despite negative wet prep obtained in the ED. Discussed options for evaluation, including repeat pelvic exam and swabs. After discussion and shared decision making we elected to treat empirically for yeast. If not improving after treatment, patient will return - Diflucan 150mg  x1 - Follow-up as necessary  Health maintenance: -hep C screening test done today -HIV and RPR obtained today for routine screening after new sexual partner -counseled extensively on COVID vaccination, gave information, patient contemplating  Pearla Dubonnet, [place]   Patient seen along with [employer] student Pearla Dubonnet. I personally evaluated this patient along with the student, and verified all aspects of the history, physical exam, and medical decision making  as documented by the student. I agree with the student's documentation and have made all necessary edits.  Chrisandra Netters, MD  [place]

## 2019-12-31 NOTE — Progress Notes (Signed)
error 

## 2020-01-01 LAB — RPR: RPR Ser Ql: NONREACTIVE

## 2020-01-01 LAB — HCV AB W REFLEX TO QUANT PCR: HCV Ab: <0.1 s/co ratio (ref 0.0–0.9)

## 2020-01-01 LAB — HCV INTERPRETATION

## 2020-01-01 LAB — HIV ANTIBODY (ROUTINE TESTING W REFLEX): HIV Screen 4th Generation wRfx: NONREACTIVE

## 2020-01-29 DIAGNOSIS — Z20828 Contact with and (suspected) exposure to other viral communicable diseases: Secondary | ICD-10-CM | POA: Diagnosis not present

## 2020-04-21 ENCOUNTER — Other Ambulatory Visit: Payer: Self-pay | Admitting: Family Medicine

## 2020-05-21 DIAGNOSIS — H5213 Myopia, bilateral: Secondary | ICD-10-CM | POA: Diagnosis not present

## 2020-08-04 ENCOUNTER — Telehealth: Payer: Self-pay

## 2020-08-04 NOTE — Telephone Encounter (Signed)
Patients mother calls nurse line requesting updated orders for the breast center. Mother reports the patient never went to have imaging done last year due to being in [place]. Mother reports the area on her breast is still bothering her, however when she called the breast center the orders had expired. Please place new orders.

## 2020-08-05 NOTE — Telephone Encounter (Addendum)
Spoke to patient and she will come in for appointment with [facility] on 08/14/2020 @0830 .  Patient is still having issues with lump in breast and wants to have surgery to remove lump but needs a referral or order.  Ozella Almond, [place]

## 2020-08-05 NOTE — Telephone Encounter (Signed)
Patient is an adult and I don't see a signed release form on file giving [place] permission to discuss her care with her mother. Also, I don't see any outstanding orders for her breast, other than that she had surgery planned back in summer 2021 but it was canceled. It may be best for patient to follow up with breast surgeon since she is still having issues.  Red team, can you call patient and ask her:  1) what orders they are referring to? Has she followed up with breast surgeon?  2) if patient desires we discuss her care with her mother, ask her to please complete a ROI form giving [place] permission to do so.  Thanks Leeanne Rio, MD

## 2020-08-05 NOTE — Telephone Encounter (Signed)
This is a good plan. Thanks! Leeanne Rio, MD

## 2020-08-10 ENCOUNTER — Emergency Department (HOSPITAL_COMMUNITY)
Admission: EM | Admit: 2020-08-10 | Discharge: 2020-08-10 | Disposition: A | Discharge status: Home / Self Care | Payer: Medicaid Other | Attending: Medical | Admitting: Medical

## 2020-08-10 ENCOUNTER — Encounter (HOSPITAL_COMMUNITY): Payer: Self-pay

## 2020-08-10 ENCOUNTER — Other Ambulatory Visit: Payer: Self-pay

## 2020-08-10 DIAGNOSIS — Z5321 Procedure and treatment not carried out due to patient leaving prior to being seen by health care provider: Secondary | ICD-10-CM | POA: Insufficient documentation

## 2020-08-10 DIAGNOSIS — G43909 Migraine, unspecified, not intractable, without status migrainosus: Secondary | ICD-10-CM | POA: Diagnosis not present

## 2020-08-10 NOTE — ED Provider Notes (Addendum)
Emergency Medicine Provider Triage Evaluation Note  Hannah Page , a 25 y.o. female  was evaluated in triage.  Pt complains of who presents with concern for a migraine that started yesterday with associated nausea and photosensitivity as well as sensitivity to loud noises.  Patient with a history of similar migraines in the past but states are usually well managed with promethazine and ibuprofen at home.  That have not helped her today.  Most recently took promethazine at 9 AM and ibuprofen at 11 AM.  Denies blurry vision or double vision, numbness, tingling, weakness in her hands, or any syncope. Denies aura, on OCPs daily. Endorses compliance.  Review of Systems  Positive: Frontal headache with associated photosensitivity and noise sensitivity consistent with her history of migraines. Negative: Chest pain, shortness of breath, vomiting, diarrhea, numbness, ting, weakness.  Physical Exam  BP 115/71 (BP Location: Left Arm)   Pulse 96   Temp 99.9 F (37.7 C) (Oral)   Resp 16   Ht 5\' 4"  (1.626 m)   Wt 70.3 kg   LMP 07/21/2020 (Approximate)   SpO2 98%   BMI 26.61 kg/m  Gen:   Awake, no distress   Resp:  Normal effort  MSK:   Moves extremities without difficulty  Other:  PERRL, EOMI, no focal deficit on neurologic exam.  Medical Decision Making  Medically screening exam initiated at 2:06 PM.  Appropriate orders placed.  Hannah Page was informed that the remainder of the evaluation will be completed by another provider, this initial triage assessment does not replace that evaluation, and the importance of remaining in the ED until their evaluation is complete.  No laboratory studies or imaging indicated at this time given reassuring physical exam and short duration of headache.  Will likely need migraine cocktail once she is moved to a room, but at this time is safe to be moved back to the waiting room.  Antiemetic and Tylenol offered, patient declined.  This  chart was dictated using voice recognition software, Dragon. Despite the best efforts of this provider to proofread and correct errors, errors may still occur which can change documentation meaning.    Emeline Darling, PA-C 08/10/20 1408    Donasia Wimes, Gypsy Balsam, PA-C 08/10/20 1409    Hayden Rasmussen, MD 08/11/20 [phone]

## 2020-08-10 NOTE — ED Triage Notes (Signed)
Patient c/o migraine since yesterday. Patient also c/o nausea and sensitivity to light. Patient has a history of the same and states prescribed meds for migraines are not working

## 2020-08-14 ENCOUNTER — Ambulatory Visit: Payer: Medicaid Other | Admitting: Family Medicine

## 2020-09-17 ENCOUNTER — Ambulatory Visit (INDEPENDENT_AMBULATORY_CARE_PROVIDER_SITE_OTHER): Payer: Medicaid Other | Admitting: Family Medicine

## 2020-09-17 ENCOUNTER — Other Ambulatory Visit: Payer: Self-pay

## 2020-09-17 DIAGNOSIS — N63 Unspecified lump in unspecified breast: Secondary | ICD-10-CM

## 2020-09-17 NOTE — Progress Notes (Signed)
    SUBJECTIVE:   CHIEF COMPLAINT / HPI:   Ms Brozowski presents today for follow-up for a breast lump that first appeared 2 years ago. She is seeking evaluation for a referral to breast surgery.  She received breast imaging a year or so ago which revealed the mass was benign. Ms. Delmas did not pursue surgery at the time because it wasn't bothering her. Since July she has been experiencing pain near the breast lump, located on the left side of her left breast, during her menstrual cycles. The lump hasn't changed in size, is not erythematous or itching. She describes the pain as stabbing in quality, 5-8/10 depending on her menstrual cycle. She only feels pain when she moves a certain way or pressure is applied to her breast. Ibuprofen does not relieve the pain. She denies fever, discomfort, N/V associated with new pain.  Her menstrual cycles are still irregular but do not cause Ms. Lasorsa concern. She takes her birth control medication regularly but still often misses a cycle. There does not notice any correlation of heavier periods with a missed cycle and   Health Maintenance - Ms. Groninger is interested in getting the first COVID-19 vaccine today. She most recently had COVID-19 at the beginning of July.  PERTINENT  PMH / PSH:  Migraines  OBJECTIVE:   BP 129/69   Pulse 67   Wt 135 lb 3.2 oz (61.3 kg)   SpO2 97%   BMI 23.21 kg/m   GEN: Well appearing, in no acute distress SKIN: Warm, dry, moist mucous membranes CV: Normal rate, rhythm. No murmurs or gallops. PULM: Clear to auscultation, normal work of breathing. BREAST: Firm mass in left breast at 2 o'clock position. Mass is about 2 cm in size. Limited mobility. Associated mild tenderness around mass with palpation. Right breast void of any mass. No axillary lymphadenopathy bilaterally. Nipples inverted bilaterally. EXTREMITIES: No edema NEUR: Grossly normal   ASSESSMENT/PLAN:   Breast mass Suspect menstruation related changes  to previously imaged benign breast lump. Lump is possibly pushing against internal structures. No concern for malignancy at this time. Referring to general surgery for imaging and breast lump removal.   Health Maintenance Deferring COVID-19 vaccines until after imaging  [place]    Patient seen along with MS[employer] student Cristobal Goldmann. I personally evaluated this patient along with the student, and verified all aspects of the history, physical exam, and medical decision making as documented by the student. I agree with the student's documentation and have made all necessary edits.  Chrisandra Netters, MD  [place]

## 2020-09-17 NOTE — Patient Instructions (Signed)
It was a pleasure seeing you.  We have sent a referral to general surgery for your breast lump removal.  Also ordered ultrasound.  After your u/s, come back for your COVID vaccine.  Be well, Dr. Ardelia Mems

## 2020-09-17 NOTE — Assessment & Plan Note (Addendum)
Suspect menstruation related changes to previously imaged benign breast lump. Lump is possibly pushing against internal structures. Lower concern for malignancy at this time. Referring to general surgery for breast lump removal, also ordered breast u/s to update imaging.

## 2020-09-29 ENCOUNTER — Other Ambulatory Visit: Payer: Self-pay

## 2020-09-29 ENCOUNTER — Ambulatory Visit
Admission: RE | Admit: 2020-09-29 | Discharge: 2020-09-29 | Disposition: A | Discharge status: Home / Self Care | Payer: Medicaid Other | Source: Ambulatory Visit | Attending: Family Medicine | Admitting: Family Medicine

## 2020-09-29 DIAGNOSIS — N63 Unspecified lump in unspecified breast: Secondary | ICD-10-CM

## 2020-09-29 DIAGNOSIS — R939 Diagnostic imaging inconclusive due to excess body fat of patient: Secondary | ICD-10-CM | POA: Diagnosis not present

## 2020-10-06 ENCOUNTER — Other Ambulatory Visit: Payer: Self-pay | Admitting: Family Medicine

## 2020-10-12 DIAGNOSIS — D242 Benign neoplasm of left breast: Secondary | ICD-10-CM | POA: Diagnosis not present

## 2020-10-13 ENCOUNTER — Ambulatory Visit: Payer: Self-pay | Admitting: Surgery

## 2020-10-13 ENCOUNTER — Other Ambulatory Visit: Payer: Self-pay | Admitting: Surgery

## 2020-10-13 DIAGNOSIS — N6320 Unspecified lump in the left breast, unspecified quadrant: Secondary | ICD-10-CM

## 2020-10-26 ENCOUNTER — Encounter (HOSPITAL_BASED_OUTPATIENT_CLINIC_OR_DEPARTMENT_OTHER): Payer: Self-pay | Admitting: Surgery

## 2020-10-26 ENCOUNTER — Other Ambulatory Visit: Payer: Self-pay

## 2020-10-28 NOTE — Progress Notes (Signed)

## 2020-11-03 ENCOUNTER — Other Ambulatory Visit: Payer: Self-pay | Admitting: Surgery

## 2020-11-03 ENCOUNTER — Ambulatory Visit
Admission: RE | Admit: 2020-11-03 | Discharge: 2020-11-03 | Disposition: A | Discharge status: Home / Self Care | Payer: Medicaid Other | Source: Ambulatory Visit | Attending: Surgery | Admitting: Surgery

## 2020-11-03 ENCOUNTER — Ambulatory Visit (HOSPITAL_BASED_OUTPATIENT_CLINIC_OR_DEPARTMENT_OTHER)
Admission: RE | Admit: 2020-11-03 | Discharge: 2020-11-03 | Disposition: A | Discharge status: Home / Self Care | Payer: Medicaid Other | Attending: Surgery | Admitting: Surgery

## 2020-11-03 ENCOUNTER — Ambulatory Visit (HOSPITAL_BASED_OUTPATIENT_CLINIC_OR_DEPARTMENT_OTHER): Payer: Medicaid Other | Admitting: Anesthesiology

## 2020-11-03 ENCOUNTER — Other Ambulatory Visit: Payer: Self-pay

## 2020-11-03 ENCOUNTER — Encounter (HOSPITAL_BASED_OUTPATIENT_CLINIC_OR_DEPARTMENT_OTHER)
Admission: RE | Disposition: A | Discharge status: Home / Self Care | Payer: Self-pay | Source: Home / Self Care | Attending: Surgery

## 2020-11-03 ENCOUNTER — Encounter (HOSPITAL_BASED_OUTPATIENT_CLINIC_OR_DEPARTMENT_OTHER): Payer: Self-pay | Admitting: Surgery

## 2020-11-03 DIAGNOSIS — N6320 Unspecified lump in the left breast, unspecified quadrant: Secondary | ICD-10-CM

## 2020-11-03 DIAGNOSIS — N6321 Unspecified lump in the left breast, upper outer quadrant: Secondary | ICD-10-CM | POA: Diagnosis not present

## 2020-11-03 DIAGNOSIS — G43909 Migraine, unspecified, not intractable, without status migrainosus: Secondary | ICD-10-CM | POA: Diagnosis not present

## 2020-11-03 DIAGNOSIS — D242 Benign neoplasm of left breast: Secondary | ICD-10-CM | POA: Insufficient documentation

## 2020-11-03 DIAGNOSIS — L309 Dermatitis, unspecified: Secondary | ICD-10-CM | POA: Diagnosis not present

## 2020-11-03 DIAGNOSIS — I493 Ventricular premature depolarization: Secondary | ICD-10-CM | POA: Diagnosis not present

## 2020-11-03 HISTORY — PX: BREAST LUMPECTOMY WITH RADIOACTIVE SEED LOCALIZATION: SHX6424

## 2020-11-03 LAB — POCT PREGNANCY, URINE: Preg Test, Ur: NEGATIVE

## 2020-11-03 SURGERY — BREAST LUMPECTOMY WITH RADIOACTIVE SEED LOCALIZATION
Anesthesia: General | Site: Breast | Laterality: Left

## 2020-11-03 MED ORDER — DEXAMETHASONE SODIUM PHOSPHATE 10 MG/ML IJ SOLN
INTRAMUSCULAR | Status: DC | PRN
  Administered 2020-11-03: 5 mg via INTRAVENOUS

## 2020-11-03 MED ORDER — BUPIVACAINE-EPINEPHRINE (PF) 0.25% -1:200000 IJ SOLN
INTRAMUSCULAR | Status: DC | PRN
  Administered 2020-11-03: 13 mL

## 2020-11-03 MED ORDER — ONDANSETRON HCL 4 MG/2ML IJ SOLN
INTRAMUSCULAR | Status: DC | PRN
  Administered 2020-11-03: 4 mg via INTRAVENOUS

## 2020-11-03 MED ORDER — ACETAMINOPHEN 325 MG PO TABS: 325.0000 mg | ORAL_TABLET | ORAL | Status: DC | PRN

## 2020-11-03 MED ORDER — HYDROCODONE-ACETAMINOPHEN 5-325 MG PO TABS: 1.0000 | ORAL_TABLET | Freq: Four times a day (QID) | ORAL | 0 refills | Status: DC | PRN

## 2020-11-03 MED ORDER — LACTATED RINGERS IV SOLN: INTRAVENOUS | Status: DC

## 2020-11-03 MED ORDER — FENTANYL CITRATE (PF) 100 MCG/2ML IJ SOLN: 25.0000 ug | INTRAMUSCULAR | Status: DC | PRN

## 2020-11-03 MED ORDER — ONDANSETRON HCL 4 MG/2ML IJ SOLN: 4.0000 mg | Freq: Once | INTRAMUSCULAR | Status: DC | PRN

## 2020-11-03 MED ORDER — OXYCODONE HCL 5 MG/5ML PO SOLN: 5.0000 mg | Freq: Once | ORAL | Status: AC | PRN

## 2020-11-03 MED ORDER — CEFAZOLIN SODIUM-DEXTROSE 2-4 GM/100ML-% IV SOLN
INTRAVENOUS | Status: AC
  Filled 2020-11-03: qty 100

## 2020-11-03 MED ORDER — FENTANYL CITRATE (PF) 100 MCG/2ML IJ SOLN
INTRAMUSCULAR | Status: DC | PRN
  Administered 2020-11-03 (×2): 25 ug via INTRAVENOUS
  Administered 2020-11-03: 50 ug via INTRAVENOUS

## 2020-11-03 MED ORDER — MIDAZOLAM HCL 5 MG/5ML IJ SOLN
INTRAMUSCULAR | Status: DC | PRN
  Administered 2020-11-03: 2 mg via INTRAVENOUS

## 2020-11-03 MED ORDER — VANCOMYCIN HCL 500 MG IV SOLR
INTRAVENOUS | Status: DC | PRN
  Administered 2020-11-03: 500 mg via TOPICAL

## 2020-11-03 MED ORDER — FENTANYL CITRATE (PF) 100 MCG/2ML IJ SOLN
INTRAMUSCULAR | Status: AC
  Filled 2020-11-03: qty 2

## 2020-11-03 MED ORDER — LIDOCAINE HCL (CARDIAC) PF 100 MG/5ML IV SOSY
PREFILLED_SYRINGE | INTRAVENOUS | Status: DC | PRN
  Administered 2020-11-03: 60 mg via INTRAVENOUS

## 2020-11-03 MED ORDER — CEFAZOLIN SODIUM-DEXTROSE 2-4 GM/100ML-% IV SOLN
2.0000 g | INTRAVENOUS | Status: AC
  Administered 2020-11-03: 2 g via INTRAVENOUS

## 2020-11-03 MED ORDER — MEPERIDINE HCL 25 MG/ML IJ SOLN: 6.2500 mg | INTRAMUSCULAR | Status: DC | PRN

## 2020-11-03 MED ORDER — CHLORHEXIDINE GLUCONATE CLOTH 2 % EX PADS: 6.0000 | MEDICATED_PAD | Freq: Once | CUTANEOUS | Status: DC

## 2020-11-03 MED ORDER — ACETAMINOPHEN 160 MG/5ML PO SOLN: 325.0000 mg | ORAL | Status: DC | PRN

## 2020-11-03 MED ORDER — OXYCODONE HCL 5 MG PO TABS
ORAL_TABLET | ORAL | Status: AC
  Filled 2020-11-03: qty 1

## 2020-11-03 MED ORDER — MIDAZOLAM HCL 2 MG/2ML IJ SOLN
INTRAMUSCULAR | Status: AC
  Filled 2020-11-03: qty 2

## 2020-11-03 MED ORDER — SODIUM CHLORIDE 0.9 % IV SOLN
INTRAVENOUS | Status: AC
  Filled 2020-11-03: qty 10

## 2020-11-03 MED ORDER — SODIUM CHLORIDE 0.9 % IV SOLN
INTRAVENOUS | Status: DC | PRN
  Administered 2020-11-03: 250 mL

## 2020-11-03 MED ORDER — OXYCODONE HCL 5 MG PO TABS
5.0000 mg | ORAL_TABLET | Freq: Once | ORAL | Status: AC | PRN
  Administered 2020-11-03: 5 mg via ORAL

## 2020-11-03 MED ORDER — PROPOFOL 10 MG/ML IV BOLUS
INTRAVENOUS | Status: DC | PRN
  Administered 2020-11-03: 200 mg via INTRAVENOUS

## 2020-11-03 SURGICAL SUPPLY — 48 items
ADH SKN CLS APL DERMABOND .7 (GAUZE/BANDAGES/DRESSINGS) ×1
APL PRP STRL LF DISP 70% ISPRP (MISCELLANEOUS) ×1
APPLIER CLIP 9.375 MED OPEN (MISCELLANEOUS)
APR CLP MED 9.3 20 MLT OPN (MISCELLANEOUS)
BINDER BREAST LRG (GAUZE/BANDAGES/DRESSINGS) ×2 IMPLANT
BINDER BREAST MEDIUM (GAUZE/BANDAGES/DRESSINGS) IMPLANT
BLADE SURG 15 STRL LF DISP TIS (BLADE) ×1 IMPLANT
BLADE SURG 15 STRL SS (BLADE) ×2
CANISTER SUC SOCK COL 7IN (MISCELLANEOUS) IMPLANT
CANISTER SUCT 1200ML W/VALVE (MISCELLANEOUS) ×2 IMPLANT
CHLORAPREP W/TINT 26 (MISCELLANEOUS) ×2 IMPLANT
CLIP APPLIE 9.375 MED OPEN (MISCELLANEOUS) IMPLANT
COVER BACK TABLE 60X90IN (DRAPES) ×2 IMPLANT
COVER MAYO STAND STRL (DRAPES) ×2 IMPLANT
COVER PROBE W GEL 5X96 (DRAPES) ×2 IMPLANT
DECANTER SPIKE VIAL GLASS SM (MISCELLANEOUS) IMPLANT
DERMABOND ADVANCED (GAUZE/BANDAGES/DRESSINGS) ×1
DERMABOND ADVANCED .7 DNX12 (GAUZE/BANDAGES/DRESSINGS) ×1 IMPLANT
DRAPE LAPAROTOMY 100X72 PEDS (DRAPES) ×2 IMPLANT
DRAPE UTILITY XL STRL (DRAPES) ×2 IMPLANT
ELECT COATED BLADE 2.86 ST (ELECTRODE) ×2 IMPLANT
ELECT REM PT RETURN 9FT ADLT (ELECTROSURGICAL) ×2
ELECTRODE REM PT RTRN 9FT ADLT (ELECTROSURGICAL) ×1 IMPLANT
GLOVE SRG 8 PF TXTR STRL LF DI (GLOVE) ×1 IMPLANT
GLOVE SURG LTX SZ8 (GLOVE) ×2 IMPLANT
GLOVE SURG POLYISO LF SZ6.5 (GLOVE) ×2 IMPLANT
GLOVE SURG UNDER POLY LF SZ6.5 (GLOVE) ×2 IMPLANT
GLOVE SURG UNDER POLY LF SZ8 (GLOVE) ×2
GOWN STRL REUS W/ TWL LRG LVL3 (GOWN DISPOSABLE) ×2 IMPLANT
GOWN STRL REUS W/ TWL XL LVL3 (GOWN DISPOSABLE) ×1 IMPLANT
GOWN STRL REUS W/TWL LRG LVL3 (GOWN DISPOSABLE) ×4
GOWN STRL REUS W/TWL XL LVL3 (GOWN DISPOSABLE) ×2
HEMOSTAT ARISTA ABSORB 3G PWDR (HEMOSTASIS) IMPLANT
HEMOSTAT SNOW SURGICEL 2X4 (HEMOSTASIS) IMPLANT
KIT MARKER MARGIN INK (KITS) ×2 IMPLANT
NEEDLE HYPO 25X1 1.5 SAFETY (NEEDLE) ×2 IMPLANT
NS IRRIG 1000ML POUR BTL (IV SOLUTION) ×2 IMPLANT
PACK BASIN DAY SURGERY FS (CUSTOM PROCEDURE TRAY) ×2 IMPLANT
PENCIL SMOKE EVACUATOR (MISCELLANEOUS) ×2 IMPLANT
SLEEVE SCD COMPRESS KNEE MED (STOCKING) ×2 IMPLANT
SPONGE T-LAP 4X18 ~~LOC~~+RFID (SPONGE) ×2 IMPLANT
SUT MNCRL AB 4-0 PS2 18 (SUTURE) ×2 IMPLANT
SUT VICRYL 3-0 CR8 SH (SUTURE) ×2 IMPLANT
SYR CONTROL 10ML LL (SYRINGE) ×2 IMPLANT
TOWEL GREEN STERILE FF (TOWEL DISPOSABLE) ×2 IMPLANT
TRAY FAXITRON CT DISP (TRAY / TRAY PROCEDURE) ×2 IMPLANT
TUBE CONNECTING 20X1/4 (TUBING) ×2 IMPLANT
YANKAUER SUCT BULB TIP NO VENT (SUCTIONS) ×2 IMPLANT

## 2020-11-03 NOTE — Op Note (Signed)
Preoperative diagnosis: Left breast fibroadenoma  Postoperative diagnosis: Same  Procedure: Seed localized left breast lumpectomy  Surgeon: Erroll Luna, MD  Anesthesia: LMA with 0.25% Marcaine plain  EBL: Minimal  Specimen: Left breast tissue with seed and clip verified by Faxitron  Drains: None  Indications for procedure: The patient is a 25 year old female with an enlarging left breast fibroadenoma causing pain.  Given change in size and discomfort she desired excision.  This was biopsied preoperatively and it was shown to be a fibroadenoma.  Risks and benefits of surgery as well as natural history and progression of fibroadenoma discussed.  Complications of surgery discussed.  Long-term expectations, recovery and expected outcome discussed.  She agreed to proceed.The procedure has been discussed with the patient. Alternatives to surgery have been discussed with the patient.  Risks of surgery include bleeding,  Infection,  Seroma formation, death,  and the need for further surgery.   The patient understands and wishes to proceed.    Description of procedure: The patient was met in the holding area.  Questions were answered and the left breast was marked as the correct site.  She was then taken back to the operating room.  She was placed supine upon the OR table.  After induction of general anesthesia, left breast was prepped and draped in a sterile fashion and timeout performed.  She received appropriate preoperative antibiotics.  Proper patient, site and procedure were verified.  Neoprobe used and seed identified left breast lateral.  Curvilinear incision was made over this.  Dissection was carried down and all tissue around the seed and clip were excised with a grossly negative margin.  This appeared to be a fibroadenoma.  The Faxitron image revealed the seed and clip to be in the specimen.  Hemostasis achieved with cautery.  Wound closed with deep layer of 3-0 Vicryl.  Vancomycin powder  placed in the cavity was infiltrated with local anesthetic.  4 Monocryl was then used to close the skin.  Dermabond applied.  All final counts found to be correct.  Breast binder placed.  The patient was awoke extubated taken to recovery in satisfactory condition.

## 2020-11-03 NOTE — Anesthesia Procedure Notes (Signed)
Procedure Name: LMA Insertion Date/Time: 11/03/2020 12:55 PM Performed by: Lavonia Dana, CRNA Pre-anesthesia Checklist: Patient identified, Emergency Drugs available, Suction available and Patient being monitored Patient Re-evaluated:Patient Re-evaluated prior to induction Oxygen Delivery Method: Circle system utilized Preoxygenation: Pre-oxygenation with 100% oxygen Induction Type: IV induction Ventilation: Mask ventilation without difficulty LMA: LMA inserted LMA Size: 4.0 Number of attempts: 1 Airway Equipment and Method: Bite block Placement Confirmation: positive ETCO2 Tube secured with: Tape Dental Injury: Teeth and Oropharynx as per pre-operative assessment

## 2020-11-03 NOTE — Discharge Instructions (Addendum)
[facility] Office Phone Number [phone]  BREAST BIOPSY/ PARTIAL MASTECTOMY: POST OP INSTRUCTIONS  Always review your discharge instruction sheet given to you by the facility where your surgery was performed.  IF YOU HAVE DISABILITY OR FAMILY LEAVE FORMS, YOU MUST BRING THEM TO THE OFFICE FOR PROCESSING.  DO NOT GIVE THEM TO YOUR DOCTOR.  A prescription for pain medication may be given to you upon discharge.  Take your pain medication as prescribed, if needed.  If narcotic pain medicine is not needed, then you may take acetaminophen (Tylenol) or ibuprofen (Advil) as needed. Take your usually prescribed medications unless otherwise directed If you need a refill on your pain medication, please contact your pharmacy.  They will contact our office to request authorization.  Prescriptions will not be filled after 5pm or on week-ends. You should eat very light the first 24 hours after surgery, such as soup, crackers, pudding, etc.  Resume your normal diet the day after surgery. Most patients will experience some swelling and bruising in the breast.  Ice packs and a good support bra will help.  Swelling and bruising can take several days to resolve.  It is common to experience some constipation if taking pain medication after surgery.  Increasing fluid intake and taking a stool softener will usually help or prevent this problem from occurring.  A mild laxative (Milk of Magnesia or Miralax) should be taken according to package directions if there are no bowel movements after 48 hours. Unless discharge instructions indicate otherwise, you may remove your bandages 24-48 hours after surgery, and you may shower at that time.  You may have steri-strips (small skin tapes) in place directly over the incision.  These strips should be left on the skin for 7-10 days.  If your surgeon used skin glue on the incision, you may shower in 24 hours.  The glue will flake off over the next 2-3 weeks.  Any  sutures or staples will be removed at the office during your follow-up visit. ACTIVITIES:  You may resume regular daily activities (gradually increasing) beginning the next day.  Wearing a good support bra or sports bra minimizes pain and swelling.  You may have sexual intercourse when it is comfortable. You may drive when you no longer are taking prescription pain medication, you can comfortably wear a seatbelt, and you can safely maneuver your car and apply brakes. RETURN TO WORK:  ______________________________________________________________________________________ You should see your doctor in the office for a follow-up appointment approximately two weeks after your surgery.  Your doctor's nurse will typically make your follow-up appointment when she calls you with your pathology report.  Expect your pathology report 2-3 business days after your surgery.  You may call to check if you do not hear from us after three days. OTHER INSTRUCTIONS: _______________________________________________________________________________________________ _____________________________________________________________________________________________________________________________________ _____________________________________________________________________________________________________________________________________ _____________________________________________________________________________________________________________________________________  WHEN TO CALL YOUR DOCTOR: Fever over 101.0 Nausea and/or vomiting. Extreme swelling or bruising. Continued bleeding from incision. Increased pain, redness, or drainage from the incision.  The clinic staff is available to answer your questions during regular business hours.  Please don't hesitate to call and ask to speak to one of the nurses for clinical concerns.  If you have a medical emergency, go to the nearest emergency room or call 911.  A surgeon from [facility] is always on call at the hospital.  For further questions, please visit [email or web address]    Post Anesthesia Home Care Instructions  Activity: Get plenty of rest for the remainder of   the day. A responsible individual must stay with you for 24 hours following the procedure.  For the next 24 hours, DO NOT: -Drive a car -Operate machinery -Drink alcoholic beverages -Take any medication unless instructed by your physician -Make any legal decisions or sign important papers.  Meals: Start with liquid foods such as gelatin or soup. Progress to regular foods as tolerated. Avoid greasy, spicy, heavy foods. If nausea and/or vomiting occur, drink only clear liquids until the nausea and/or vomiting subsides. Call your physician if vomiting continues.  Special Instructions/Symptoms: Your throat may feel dry or sore from the anesthesia or the breathing tube placed in your throat during surgery. If this causes discomfort, gargle with warm salt water. The discomfort should disappear within 24 hours.  If you had a scopolamine patch placed behind your ear for the management of post- operative nausea and/or vomiting:  1. The medication in the patch is effective for 72 hours, after which it should be removed.  Wrap patch in a tissue and discard in the trash. Wash hands thoroughly with soap and water. 2. You may remove the patch earlier than 72 hours if you experience unpleasant side effects which may include dry mouth, dizziness or visual disturbances. 3. Avoid touching the patch. Wash your hands with soap and water after contact with the patch.      

## 2020-11-03 NOTE — Transfer of Care (Signed)
Immediate Anesthesia Transfer of Care Note  Patient: Hannah Page  Procedure(s) Performed: LEFT BREAST LUMPECTOMY WITH RADIOACTIVE SEED LOCALIZATION (Left: Breast)  Patient Location: PACU  Anesthesia Type:General  Level of Consciousness: awake, alert  and oriented  Airway & Oxygen Therapy: Patient Spontanous Breathing and Patient connected to face mask oxygen  Post-op Assessment: Report given to RN and Post -op Vital signs reviewed and stable  Post vital signs: Reviewed and stable  Last Vitals:  Vitals Value Taken Time  BP 140/90 11/03/20 1345  Temp    Pulse 104 11/03/20 1348  Resp 19 11/03/20 1348  SpO2 98 % 11/03/20 1348  Vitals shown include unvalidated device data.  Last Pain:  Vitals:   11/03/20 1139  TempSrc: Oral  PainSc: 0-No pain      Patients Stated Pain Goal: 4 (17/40/81 4481)  Complications: No notable events documented.

## 2020-11-03 NOTE — Anesthesia Preprocedure Evaluation (Addendum)
Anesthesia Evaluation  Patient identified by MRN, date of birth, ID band Patient awake    Reviewed: Allergy & Precautions, H&P , NPO status , Patient's Chart, lab work & pertinent test results, reviewed documented beta blocker date and time   Airway Mallampati: I  TM Distance: >3 FB Neck ROM: full    Dental no notable dental hx. (+) Dental Advisory Given, Teeth Intact   Pulmonary neg pulmonary ROS,    Pulmonary exam normal breath sounds clear to auscultation       Cardiovascular Exercise Tolerance: Good negative cardio ROS   Rhythm:regular Rate:Normal     Neuro/Psych  Headaches, neg Seizures negative psych ROS   GI/Hepatic negative GI ROS, Neg liver ROS,   Endo/Other  negative endocrine ROS  Renal/GU negative Renal ROS  negative genitourinary   Musculoskeletal  (+) Arthritis , Osteoarthritis,    Abdominal   Peds  Hematology negative hematology ROS (+)   Anesthesia Other Findings   Reproductive/Obstetrics negative OB ROS                            Anesthesia Physical Anesthesia Plan  ASA: 2  Anesthesia Plan: General   Post-op Pain Management:    Induction: Intravenous  PONV Risk Score and Plan: 3 and Ondansetron and Dexamethasone  Airway Management Planned: LMA and Oral ETT  Additional Equipment: None  Intra-op Plan:   Post-operative Plan:   Informed Consent: I have reviewed the patients History and Physical, chart, labs and discussed the procedure including the risks, benefits and alternatives for the proposed anesthesia with the patient or authorized representative who has indicated his/her understanding and acceptance.     Dental Advisory Given  Plan Discussed with: CRNA and Anesthesiologist  Anesthesia Plan Comments: ( )        Anesthesia Quick Evaluation

## 2020-11-03 NOTE — H&P (Signed)
History of Present Illness: Hannah Page is a 25 y.o. female who is seen today as an office consultation at the request of Dr. Jeanmarie Plant for evaluation of breast .   Patient presents for evaluation of left breast fibroadenoma. This was diagnosed in 2021 by ultrasound and core biopsy. It causes pain especially during the peak of her cycle. Location left breast upper outer quadrant. Size remained stable. She desires removal due to pain.  Review of Systems: A complete review of systems was obtained from the patient. I have reviewed this information and discussed as appropriate with the patient. See HPI as well for other ROS.    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   ibuprofen (ADVIL,MOTRIN) 600 MG tablet Take by mouth   meloxicam (MOBIC) 15 MG tablet Take 15 mg by mouth once daily   norgestimate-ethinyl estradiol (ORTHO-CYCLEN,SPINTEC,PREVIFEM) 0.25-35 mg-mcg tablet Take by mouth.   No current facility-administered medications on file prior to visit.   Family History  Problem Relation Age of Onset   Myocardial Infarction (Heart attack) Maternal Grandmother 52   No Known Problems Brother    Social History   Tobacco Use  Smoking Status Never Smoker  Smokeless Tobacco Never Used    Social History   Socioeconomic History   Marital status: Single  Tobacco Use   Smoking status: Never Smoker   Smokeless tobacco: Never Used  Substance and Sexual  Activity   Alcohol use: No   Drug use: No   Sexual activity: Never   Objective:   Vitals:  10/12/20 0923  BP: 120/60  Pulse: 76  Temp: 37 C (98.6 F)  SpO2: 98%  Weight: 62.9 kg (138 lb 9.6 oz)  Height: 167.6 cm (5\' 6" )   Body mass index is 22.37 kg/m.  Physical Exam Constitutional:  Appearance: Normal appearance.  HENT:  Head: Normocephalic and atraumatic.  Nose: Nose normal.  Mouth/Throat:  Mouth: Mucous membranes are moist.  Eyes:  General: No scleral icterus. Pupils: Pupils are equal, round, and reactive to light.  Cardiovascular:  Rate and Rhythm: Normal rate and regular rhythm.  Pulmonary:  Effort: Pulmonary effort is normal.  Breath sounds: No stridor.  Chest:  Breasts:  Right: Normal.  Left: Mass present.   Musculoskeletal:  General: Normal range of motion.  Cervical back: Normal range of motion and neck supple.  Lymphadenopathy:  Upper Body:  Right upper body: No supraclavicular or axillary adenopathy.  Left upper body: No supraclavicular or axillary adenopathy.  Skin: General: Skin is warm and dry.  Neurological:  General: No focal deficit present.  Mental Status: She is alert.  Psychiatric:  Mood and Affect: Mood normal.  Behavior: Behavior normal.     Labs, Imaging and Diagnostic Testing: 25 year old female presenting with new pain at a left breast mass that has been followed since 2017.   EXAM: ULTRASOUND OF THE LEFT BREAST   COMPARISON: Previous exam(s).   FINDINGS: Targeted ultrasound  is performed in the left breast at 2:30 o'clock 5 cm from the nipple demonstrating an oval circumscribed hypoechoic mass measuring 1.5 x 0.6 x 1.3 cm, previously measuring 1.5 x 0.6 x 1.6 cm. There is a small amount of peripheral blood flow. This corresponds to the mass and area of pain felt by the patient.   IMPRESSION: Stable benign mass in the left breast at 2:30 o'clock measuring 1.5 cm. 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 Given  the new pain, the patient wishes to pursue surgical excision.   RECOMMENDATION: Ultrasound-guided core needle biopsy of the left breast mass in preparation for surgical excision. This will be scheduled at the patient's earliest convenience.   BI-RADS CATEGORY 2: Benign.     Electronically Signed By: Audie Pinto M.D. On: 04/23/2019 14:43 Diagnosis Breast, left, needle core biopsy, 2:30 o'clock - FIBROADENOMA. - NO EVIDENCE OF MALIGNANCY. Assessment and Plan:  Diagnoses and all orders for this visit:  Fibroadenoma of left breast    Patient is opted for left breast seed lumpectomy due to pain. Risks and benefits of surgery discussed. Cosmetic issues discussed. Recurrence rates discussed. Nonoperative management also discussed. Risk of bleeding, infection, cosmetic deformity, pain, swelling, drainage, need for other procedures and or treatments discussed. Use of seed discussed. She wishes to proceed.  Left breast seed lumpectomy No follow-ups on file.  Kennieth Francois, MD   Total time 45 min face to face , documentation, discussion of surgery and chart review

## 2020-11-03 NOTE — Anesthesia Postprocedure Evaluation (Signed)
Anesthesia Post Note  Patient: Hannah Page  Procedure(s) Performed: LEFT BREAST LUMPECTOMY WITH RADIOACTIVE SEED LOCALIZATION (Left: Breast)     Patient location during evaluation: PACU Anesthesia Type: General Level of consciousness: awake and alert Pain management: pain level controlled Vital Signs Assessment: post-procedure vital signs reviewed and stable Respiratory status: spontaneous breathing, nonlabored ventilation and respiratory function stable Cardiovascular status: blood pressure returned to baseline and stable Postop Assessment: no apparent nausea or vomiting Anesthetic complications: no   No notable events documented.  Last Vitals:  Vitals:   11/03/20 1400 11/03/20 1425  BP: 130/74 119/80  Pulse: 88 80  Resp: 17 14  Temp:  36.9 C  SpO2: 100% 97%    Last Pain:  Vitals:   11/03/20 1425  TempSrc:   PainSc: 2                  Lynda Rainwater

## 2020-11-04 LAB — SURGICAL PATHOLOGY

## 2020-11-05 ENCOUNTER — Encounter: Payer: Self-pay | Admitting: Surgery

## 2020-11-06 ENCOUNTER — Encounter (HOSPITAL_BASED_OUTPATIENT_CLINIC_OR_DEPARTMENT_OTHER): Payer: Self-pay | Admitting: Surgery

## 2020-12-10 ENCOUNTER — Other Ambulatory Visit: Payer: Self-pay | Admitting: Family Medicine

## 2020-12-23 ENCOUNTER — Encounter: Payer: Self-pay | Admitting: Family Medicine

## 2020-12-23 ENCOUNTER — Other Ambulatory Visit: Payer: Self-pay

## 2020-12-23 ENCOUNTER — Ambulatory Visit (INDEPENDENT_AMBULATORY_CARE_PROVIDER_SITE_OTHER): Payer: Medicaid Other | Admitting: Family Medicine

## 2020-12-23 VITALS — BP 113/76 | HR 74 | Temp 98.2°F | Ht 64.0 in | Wt 133.4 lb

## 2020-12-23 DIAGNOSIS — R3989 Other symptoms and signs involving the genitourinary system: Secondary | ICD-10-CM

## 2020-12-23 DIAGNOSIS — N3090 Cystitis, unspecified without hematuria: Secondary | ICD-10-CM | POA: Diagnosis not present

## 2020-12-23 LAB — POCT URINALYSIS DIP (MANUAL ENTRY)
Bilirubin, UA: NEGATIVE
Glucose, UA: NEGATIVE mg/dL
Ketones, POC UA: NEGATIVE mg/dL
Nitrite, UA: NEGATIVE
Protein Ur, POC: 100 mg/dL — AB
Spec Grav, UA: 1.025 (ref 1.010–1.025)
Urobilinogen, UA: 1 E.U./dL
pH, UA: 6 (ref 5.0–8.0)

## 2020-12-23 MED ORDER — CEPHALEXIN 500 MG PO CAPS: 500.0000 mg | ORAL_CAPSULE | Freq: Two times a day (BID) | ORAL | 0 refills | Status: AC

## 2020-12-23 NOTE — Progress Notes (Signed)
    SUBJECTIVE:   CHIEF COMPLAINT / HPI:   Reports 1 week of urinary frequency, dysuria, and urinary urgency.  Had some back pain initially but this resolved.  Took Azo for first 2 days with some improvement but symptoms persist.  Denies fever.  She reports good adherence with her OCPs.  LMP 2 weeks ago.  PERTINENT  PMH / PSH: Noncontributory  OBJECTIVE:   BP 113/76   Pulse 74   Temp 98.2 F (36.8 C)   Ht 5\' 4"  (1.626 m)   Wt 133 lb 6.4 oz (60.5 kg)   SpO2 99%   BMI 22.90 kg/m   General: Alert, NAD CV: RRR, no murmurs Pulm: CTAB, no wheezes or rales Abdomen: Soft, nontender, no CVA tenderness  ASSESSMENT/PLAN:   Acute cystitis, uncomplicated Typical UTI symptoms with large leukocytes on UA though no nitrites. Will treat with cephalexin 500 mg BID x 7 days. Urine culture sent.    Zola Button, MD [place]

## 2020-12-23 NOTE — Patient Instructions (Addendum)
It was nice seeing you today!  Take antibiotics twice a day for 7 days.  I will let you know if we need to change antibiotics.  Stay well, Zola Button, MD [place] [phone]

## 2020-12-26 LAB — URINE CULTURE

## 2021-03-18 ENCOUNTER — Encounter (HOSPITAL_COMMUNITY): Payer: Self-pay

## 2021-05-21 ENCOUNTER — Other Ambulatory Visit: Payer: Self-pay | Admitting: Family Medicine

## 2021-05-24 NOTE — Telephone Encounter (Signed)
Please let patient know I am refilling this medication, but she needs to schedule an appointment with me for her yearly physical ? ?Thanks, ?Leeanne Rio, MD  ?

## 2021-06-15 ENCOUNTER — Other Ambulatory Visit (HOSPITAL_COMMUNITY)
Admission: RE | Admit: 2021-06-15 | Discharge: 2021-06-15 | Disposition: A | Discharge status: Home / Self Care | Payer: Medicaid Other | Source: Ambulatory Visit | Attending: Family Medicine | Admitting: Family Medicine

## 2021-06-15 ENCOUNTER — Encounter: Payer: Self-pay | Admitting: Family Medicine

## 2021-06-15 ENCOUNTER — Ambulatory Visit (INDEPENDENT_AMBULATORY_CARE_PROVIDER_SITE_OTHER): Payer: Medicaid Other | Admitting: Family Medicine

## 2021-06-15 VITALS — BP 129/69 | HR 75 | Ht 64.0 in | Wt 134.8 lb

## 2021-06-15 DIAGNOSIS — Z124 Encounter for screening for malignant neoplasm of cervix: Secondary | ICD-10-CM | POA: Diagnosis not present

## 2021-06-15 DIAGNOSIS — Z113 Encounter for screening for infections with a predominantly sexual mode of transmission: Secondary | ICD-10-CM | POA: Diagnosis not present

## 2021-06-15 DIAGNOSIS — G43009 Migraine without aura, not intractable, without status migrainosus: Secondary | ICD-10-CM | POA: Diagnosis not present

## 2021-06-15 DIAGNOSIS — Z0000 Encounter for general adult medical examination without abnormal findings: Secondary | ICD-10-CM | POA: Diagnosis not present

## 2021-06-15 NOTE — Assessment & Plan Note (Signed)
Well controlled with advil and phenergan as needed, continue.
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
## 2021-06-15 NOTE — Patient Instructions (Signed)

## 2021-06-16 LAB — CYTOLOGY - PAP
Chlamydia: NEGATIVE
Comment: NEGATIVE
Comment: NEGATIVE
Comment: NORMAL
Diagnosis: NEGATIVE
Neisseria Gonorrhea: NEGATIVE
Trichomonas: NEGATIVE

## 2021-06-16 LAB — RPR: RPR Ser Ql: NONREACTIVE

## 2021-06-16 LAB — HIV ANTIBODY (ROUTINE TESTING W REFLEX): HIV Screen 4th Generation wRfx: NONREACTIVE

## 2021-06-25 LAB — CERVICOVAGINAL ANCILLARY ONLY
Chlamydia: NEGATIVE
Chlamydia: NEGATIVE
Comment: NEGATIVE
Comment: NEGATIVE
Comment: NEGATIVE
Comment: NEGATIVE
Comment: NORMAL
Comment: NORMAL
Neisseria Gonorrhea: NEGATIVE
Neisseria Gonorrhea: NEGATIVE
Trichomonas: NEGATIVE
Trichomonas: NEGATIVE

## 2021-06-29 ENCOUNTER — Encounter: Payer: Self-pay | Admitting: *Deleted

## 2021-08-30 ENCOUNTER — Telehealth: Payer: Self-pay | Admitting: Family Medicine

## 2021-08-30 MED ORDER — NORETHINDRONE 0.35 MG PO TABS: 1.0000 | ORAL_TABLET | Freq: Every day | ORAL | 11 refills | Status: DC

## 2021-08-30 NOTE — Telephone Encounter (Signed)
Called patient. She desires to restart norethindrone. Not sexually active with female partner at present but may become sexually active with one in the future and desires to protect herself.  She is aware must be taken same time every day to be effective. 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  Rx sent in - advised if she wants to discuss other options that are progesterone only we can do so in an office visit.  Patient appreciative  Leeanne Rio, MD

## 2021-08-30 NOTE — Telephone Encounter (Signed)
Patient is calling and would like Dr. Ardelia Mems to call her concerning restarting birth control. I informed patient that she would need appointment to discuss and she told me that Dr. Ardelia Mems asked her to just give her a call.   The best call back number is [phone]

## 2021-08-30 NOTE — Addendum Note (Signed)
Addended by: Leeanne Rio on: 08/30/2021 05:04 PM   Modules accepted: Orders

## 2021-09-13 ENCOUNTER — Encounter (HOSPITAL_COMMUNITY): Payer: Self-pay

## 2021-09-13 ENCOUNTER — Other Ambulatory Visit: Payer: Self-pay

## 2021-09-13 ENCOUNTER — Emergency Department (HOSPITAL_COMMUNITY)
Admission: EM | Admit: 2021-09-13 | Discharge: 2021-09-13 | Disposition: A | Discharge status: Home / Self Care | Payer: Medicaid Other | Attending: Emergency Medicine | Admitting: Emergency Medicine

## 2021-09-13 DIAGNOSIS — B9689 Other specified bacterial agents as the cause of diseases classified elsewhere: Secondary | ICD-10-CM | POA: Diagnosis not present

## 2021-09-13 DIAGNOSIS — N898 Other specified noninflammatory disorders of vagina: Secondary | ICD-10-CM | POA: Diagnosis present

## 2021-09-13 DIAGNOSIS — N760 Acute vaginitis: Secondary | ICD-10-CM | POA: Insufficient documentation

## 2021-09-13 LAB — URINALYSIS, ROUTINE W REFLEX MICROSCOPIC
Bilirubin Urine: NEGATIVE
Glucose, UA: NEGATIVE mg/dL
Ketones, ur: NEGATIVE mg/dL
Leukocytes,Ua: NEGATIVE
Nitrite: NEGATIVE
Protein, ur: NEGATIVE mg/dL
Specific Gravity, Urine: 1.02 (ref 1.005–1.030)
pH: 8 (ref 5.0–8.0)

## 2021-09-13 LAB — WET PREP, GENITAL
Sperm: NONE SEEN
Trich, Wet Prep: NONE SEEN
WBC, Wet Prep HPF POC: <10 (ref <10)
Yeast Wet Prep HPF POC: NONE SEEN

## 2021-09-13 LAB — PREGNANCY, URINE: Preg Test, Ur: NEGATIVE

## 2021-09-13 MED ORDER — METRONIDAZOLE 500 MG PO TABS: 500.0000 mg | ORAL_TABLET | Freq: Two times a day (BID) | ORAL | 0 refills | Status: DC

## 2021-09-13 NOTE — ED Provider Triage Note (Signed)
Emergency Medicine Provider Triage Evaluation Note  [name] , a 26 y.o. female  was evaluated in triage.  Pt complains of vaginal DC for the past 3 days.   No urinary issues.   Female partner only (female in the past) without condoms.   Review of Systems  Positive: Vag DC Negative: Fever, pain  Physical Exam  BP 113/79 (BP Location: Left Arm)   Pulse 74   Temp 99.2 F (37.3 C) (Oral)   Resp 16   Ht '5\' 4"'$  (1.626 m)   Wt 59.4 kg   SpO2 96%   BMI 22.49 kg/m  Gen:   Awake, no distress   Resp:  Normal effort  MSK:   Moves extremities without difficulty  Other:    Medical Decision Making  Medically screening exam initiated at 4:48 PM.  Appropriate orders placed.  Harle Battiest Loney was informed that the remainder of the evaluation will be completed by another provider, this initial triage assessment does not replace that evaluation, and the importance of remaining in the ED until their evaluation is complete.  Self swab and urine   Tedd Sias, [place] 09/13/21 1653

## 2021-09-13 NOTE — ED Triage Notes (Signed)
Pt reports brown, malodorous vaginal discharge that began a few days ago. Pt reports she restarted birth control about a a week or two ago. Pt also reports having unprotected sex recently and is concerned she might have an STD.

## 2021-09-13 NOTE — ED Provider Notes (Signed)
[facility] DEPT Provider Note   CSN: [id number] Arrival date & time: 09/13/21  1621     History  Chief Complaint  Patient presents with   Vaginal Discharge    Hannah Page is a 26 y.o. female.   Vaginal Discharge  Hannah Page , a 26 y.o. female  was evaluated in triage.  Pt complains of vaginal DC for the past 3 days.    No urinary issues.    Female partner only (female in the past) without condoms.      Home Medications Prior to Admission medications   Medication Sig Start Date End Date Taking? Authorizing Provider  metroNIDAZOLE (FLAGYL) 500 MG tablet Take 1 tablet (500 mg total) by mouth 2 (two) times daily. 09/13/21  Yes Tenille Morrill S, PA  ibuprofen (ADVIL) 600 MG tablet TAKE 1 TABLET BY MOUTH EVERY 8 HOURS AS NEEDED. 05/24/21   Leeanne Rio, MD  norethindrone (MICRONOR) 0.35 MG tablet Take 1 tablet (0.35 mg total) by mouth daily. 08/30/21   Leeanne Rio, MD  promethazine (PHENERGAN) 12.5 MG tablet TAKE 1 TABLET (12.5 MG TOTAL) BY MOUTH EVERY 8 (EIGHT) HOURS AS NEEDED FOR NAUSEA OR VOMITING. 05/24/21   Leeanne Rio, MD      Allergies    Prednisone    Review of Systems   Review of Systems  Genitourinary:  Positive for vaginal discharge.    Physical Exam Updated Vital Signs BP 116/82 (BP Location: Left Arm)   Pulse 80   Temp 98.9 F (37.2 C) (Oral)   Resp 18   Ht '5\' 4"'$  (1.626 m)   Wt 59.4 kg   SpO2 100%   BMI 22.49 kg/m  Physical Exam Vitals and nursing note reviewed.  Constitutional:      General: She is not in acute distress.    Appearance: Normal appearance. She is not ill-appearing.  HENT:     Head: Normocephalic and atraumatic.  Eyes:     General: No scleral icterus.       Right eye: No discharge.        Left eye: No discharge.     Conjunctiva/sclera: Conjunctivae normal.  Pulmonary:     Effort: Pulmonary effort is normal.     Breath sounds: No stridor.   Genitourinary:    Comments: Deferred  Neurological:     Mental Status: She is alert and oriented to person, place, and time. Mental status is at baseline.     ED Results / Procedures / Treatments   Labs (all labs ordered are listed, but only abnormal results are displayed) Labs Reviewed  WET PREP, GENITAL - Abnormal; Notable for the following components:      Result Value   Clue Cells Wet Prep HPF POC PRESENT (*)    All other components within normal limits  URINALYSIS, ROUTINE W REFLEX MICROSCOPIC - Abnormal; Notable for the following components:   APPearance CLOUDY (*)    Hgb urine dipstick MODERATE (*)    Bacteria, UA RARE (*)    All other components within normal limits  PREGNANCY, URINE  GC/CHLAMYDIA PROBE AMP ([place]) NOT AT [facility]    EKG None  Radiology No results found.  Procedures Procedures    Medications Ordered in ED Medications - No data to display  ED Course/ Medical Decision Making/ A&P  Medical Decision Making Amount and/or Complexity of Data Reviewed Labs: ordered.  Risk Prescription drug management.   Hannah Page , a 26 y.o. female  was evaluated in triage.  Pt complains of vaginal DC for the past 3 days.    No urinary issues.    Female partner only (female in the past) without condoms.    Patient with some vaginal discharge.  She has a history of BV.  She is somewhat concerned about STDs.  She is requesting being tested.  She is not having any pain.  Declined pelvic exam would prefer to self swab.  She will follow-up with the PCP that she has however would like to be treated for BV today.  We decided to make conversation about treatment empirically for STDs.  She would prefer to hold off.  Return precautions given.  Urine present as negative.  Urinalysis unremarkable.   Final Clinical Impression(s) / ED Diagnoses Final diagnoses:  BV (bacterial vaginosis)    Rx / DC Orders ED Discharge Orders           Ordered    metroNIDAZOLE (FLAGYL) 500 MG tablet  2 times daily        09/13/21 1957              Tedd Sias, [place] 55/73/22 0254    Campbell Stall P, DO 27/06/23 2128

## 2021-09-14 LAB — GC/CHLAMYDIA PROBE AMP (~~LOC~~) NOT AT ARMC
Chlamydia: NEGATIVE
Comment: NEGATIVE
Comment: NORMAL
Neisseria Gonorrhea: NEGATIVE

## 2021-11-02 ENCOUNTER — Ambulatory Visit (INDEPENDENT_AMBULATORY_CARE_PROVIDER_SITE_OTHER): Payer: BC Managed Care – PPO

## 2021-11-02 DIAGNOSIS — Z111 Encounter for screening for respiratory tuberculosis: Secondary | ICD-10-CM | POA: Diagnosis not present

## 2021-11-04 ENCOUNTER — Ambulatory Visit (INDEPENDENT_AMBULATORY_CARE_PROVIDER_SITE_OTHER): Payer: Medicaid Other

## 2021-11-04 DIAGNOSIS — Z111 Encounter for screening for respiratory tuberculosis: Secondary | ICD-10-CM

## 2021-11-04 LAB — TB SKIN TEST
Induration: 0 mm
TB Skin Test: NEGATIVE

## 2021-11-04 NOTE — Progress Notes (Signed)
Patient is here for a PPD placement.  PPD placed in left forearm @ 4:00 pm.  Patient will return 11/04/2021 to have PPD read.   Patient also has employment paperwork that needs to be completed by provider. Dr. Thompson Grayer (covering for Dr. Ardelia Mems) has completed physician portion of form. Will provide to patient when she returns for PPD read.    Talbot Grumbling, RN

## 2021-11-05 NOTE — Progress Notes (Signed)
Patient is here for a PPD read.  It was placed on 11/02/2021 in the left forearm @ 4 pm.    PPD RESULTS:  Result: negative Induration: 0 mm  Letter created and given to patient for documentation purposes. Also provided patient with employment paperwork. Faxed form to [facility]. ROI signed. Copy made and placed in batch scanning.    Talbot Grumbling, RN

## 2021-11-09 ENCOUNTER — Ambulatory Visit (INDEPENDENT_AMBULATORY_CARE_PROVIDER_SITE_OTHER): Payer: BC Managed Care – PPO | Admitting: Family Medicine

## 2021-11-09 ENCOUNTER — Encounter: Payer: Self-pay | Admitting: Family Medicine

## 2021-11-09 VITALS — BP 113/67 | HR 92

## 2021-11-09 DIAGNOSIS — J029 Acute pharyngitis, unspecified: Secondary | ICD-10-CM

## 2021-11-09 DIAGNOSIS — J069 Acute upper respiratory infection, unspecified: Secondary | ICD-10-CM

## 2021-11-09 LAB — POCT INFLUENZA A/B
Influenza A, POC: NEGATIVE
Influenza B, POC: NEGATIVE

## 2021-11-09 NOTE — Patient Instructions (Addendum)
It was nice seeing you today!  You can try Flonase as needed for congestion.  You can take Tylenol or ibuprofen as needed for body aches.  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  Let us know if you are not getting better in 1 week.  Stay well, Zola Button, MD [place] [phone]  --  Make sure to check out at the front desk before you leave today.  Please arrive at least 15 minutes prior to your scheduled appointments.  If you had blood work today, I will send you a MyChart message or a letter if results are normal. Otherwise, I will give you a call.  If you had a referral placed, they will call you to set up an appointment. Please give [place] a call if you don't hear back in the next 2 weeks.  If you need additional refills before your next appointment, please call your pharmacy first.

## 2021-11-09 NOTE — Progress Notes (Signed)
    SUBJECTIVE:   CHIEF COMPLAINT / HPI:  Chief Complaint  Patient presents with   Sore Throat    Patient is here with cough, sore throat, congestion, and body aches that started 3 days ago. Overall getting better. Denies fever, chills, chest pain, SOB. She is a Pharmacist, hospital. No known sick contacts including known Covid exposure.  PERTINENT  PMH / PSH: non-contributory  Patient Care Team: Leeanne Rio, MD as PCP - General (Family Medicine)   OBJECTIVE:   BP 113/67   Pulse 92   LMP 11/01/2021   SpO2 97%   Physical Exam Constitutional:      General: She is not in acute distress. HENT:     Head: Normocephalic and atraumatic.     Comments: No frontal or maxillary sinus tenderness    Mouth/Throat:     Mouth: Mucous membranes are moist.     Pharynx: Posterior oropharyngeal erythema present. No oropharyngeal exudate.  Eyes:     Extraocular Movements: Extraocular movements intact.  Cardiovascular:     Rate and Rhythm: Normal rate and regular rhythm.  Pulmonary:     Effort: Pulmonary effort is normal. No respiratory distress.     Breath sounds: Normal breath sounds.  Musculoskeletal:     Cervical back: Neck supple.  Neurological:     Mental Status: She is alert.         11/09/2021    1:35 PM  Depression screen PHQ 2/9  Decreased Interest 1  Down, Depressed, Hopeless 0  PHQ - 2 Score 1  Altered sleeping 1  Tired, decreased energy 1  Change in appetite 0  Feeling bad or failure about yourself  0  Trouble concentrating 0  Moving slowly or fidgety/restless 0  Suicidal thoughts 0  PHQ-9 Score 3  Difficult doing work/chores Not difficult at all     {Show previous vital signs (optional):23777}    ASSESSMENT/PLAN:   Viral URI Viral prodrome x3d, improving. Low suspicion for bacterial etiology. - continue supportive care - Covid/flu testing - written out of work until 10/19 pending Covid test results  Return if symptoms worsen or fail to improve.    Zola Button, MD [place]

## 2021-11-11 LAB — COVID-19, FLU A+B NAA
Influenza A, NAA: NOT DETECTED
Influenza B, NAA: NOT DETECTED
SARS-CoV-2, NAA: NOT DETECTED

## 2021-12-14 IMAGING — US US BREAST BX W LOC DEV 1ST LESION IMG BX SPEC US GUIDE*L*
1 series · 8 of 8 positions shown · non-contrast
Comparison: Previous exam(s).
COMPARISON: Previous exam(s).

Addendum:
CLINICAL DATA: Patient with palpable painful left breast mass.
Patient desires to have definitive diagnosis and surgical
consultation for consideration of excision.

EXAM:
ULTRASOUND GUIDED LEFT BREAST CORE NEEDLE BIOPSY

[Series 1: us breast bx w loc dev 1st lesion img bx spec us g · 0.06mm/px · 8 of 8 slices shown]
[im 1/8]
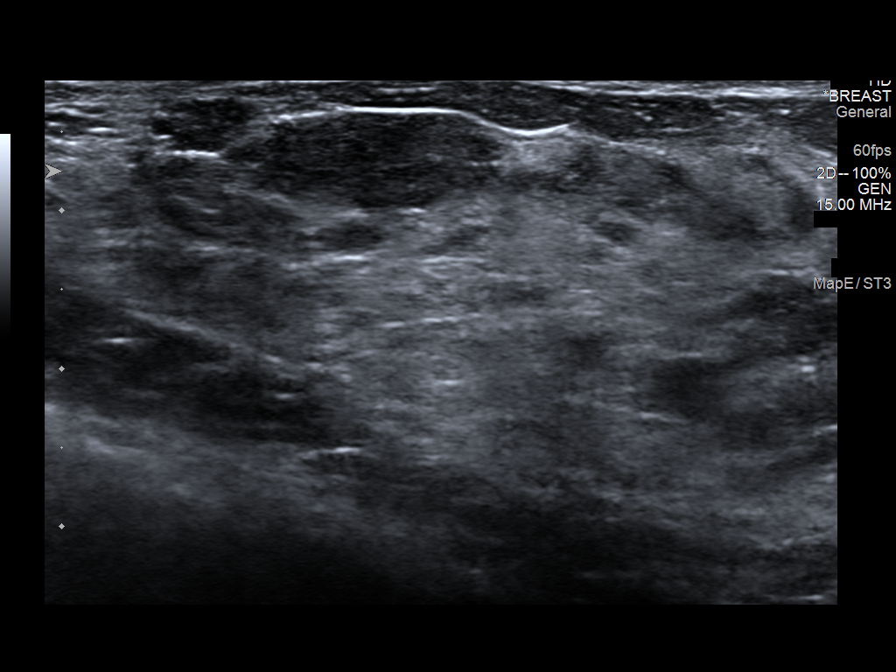
[im 2/8]
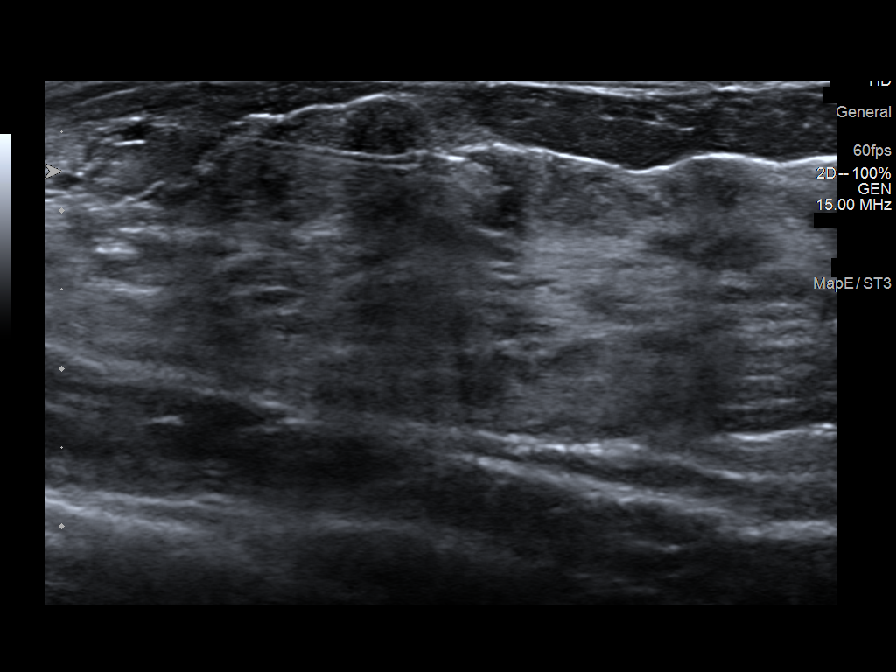
[im 3/8]
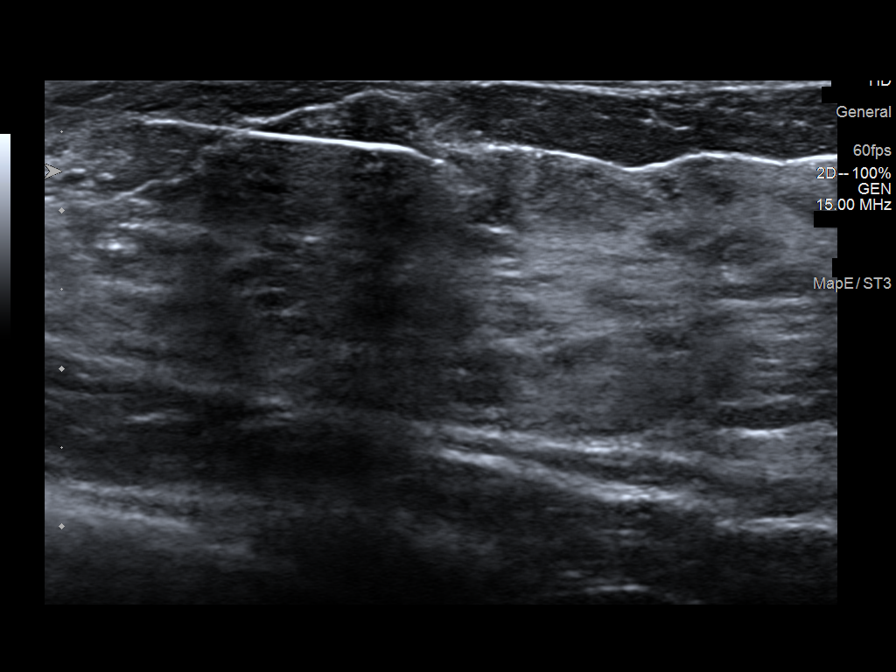
[im 4/8]
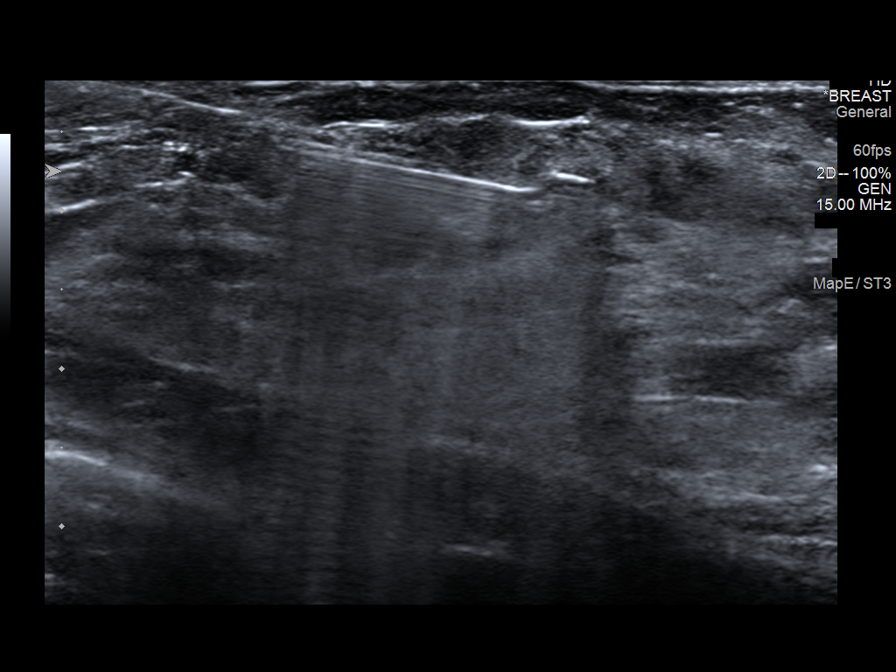
[im 5/8]
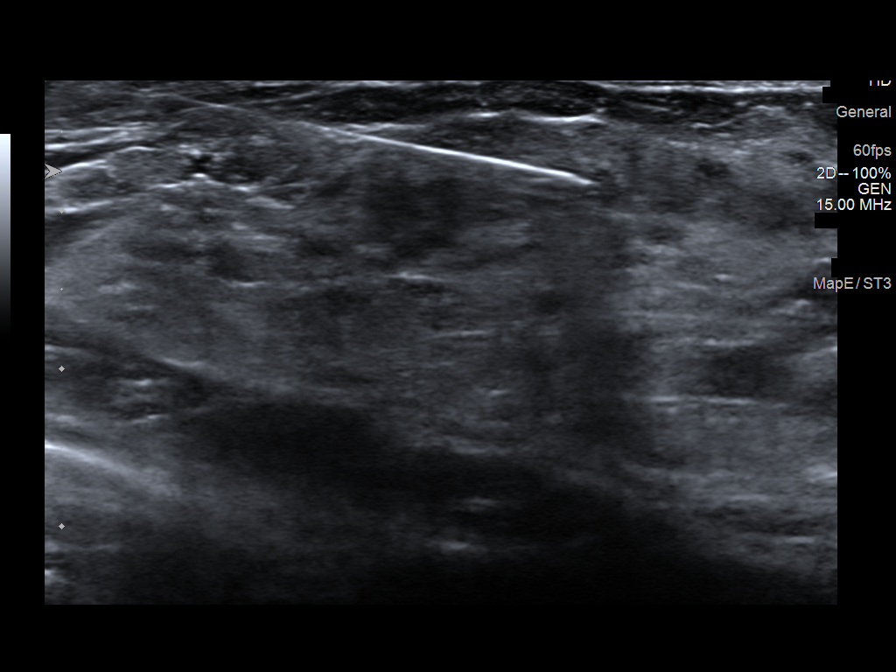
[im 6/8]
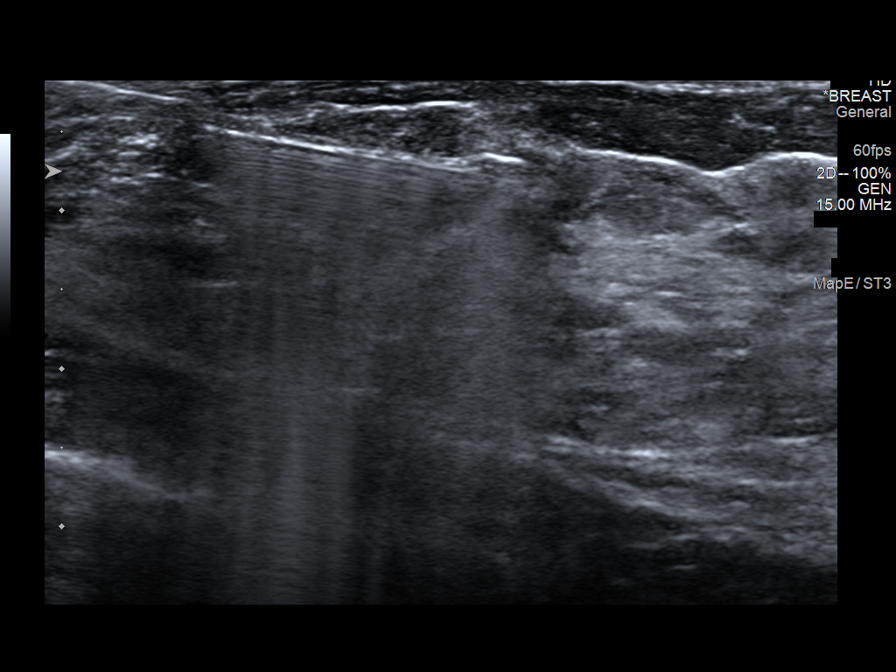
[im 7/8]
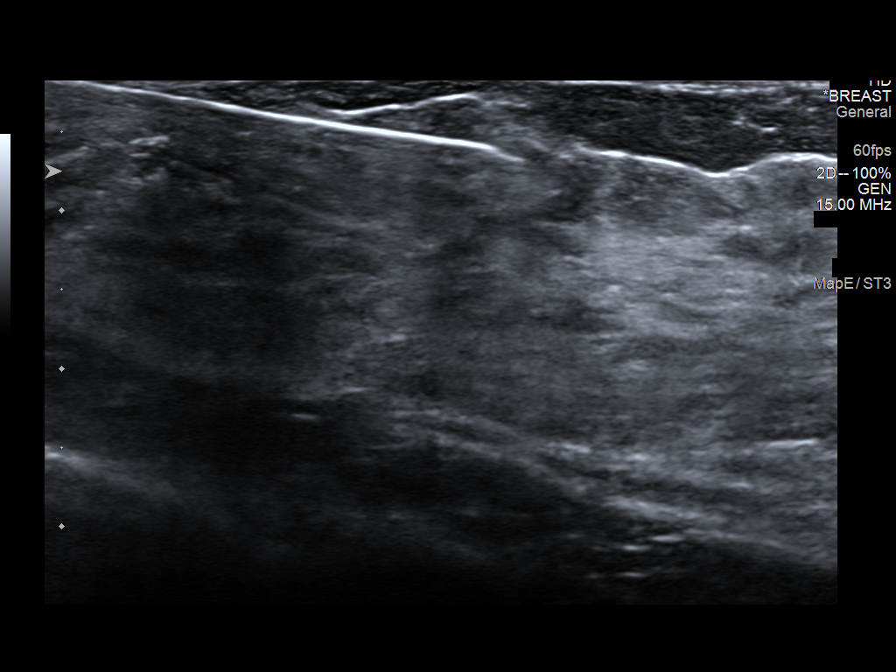
[im 8/8]
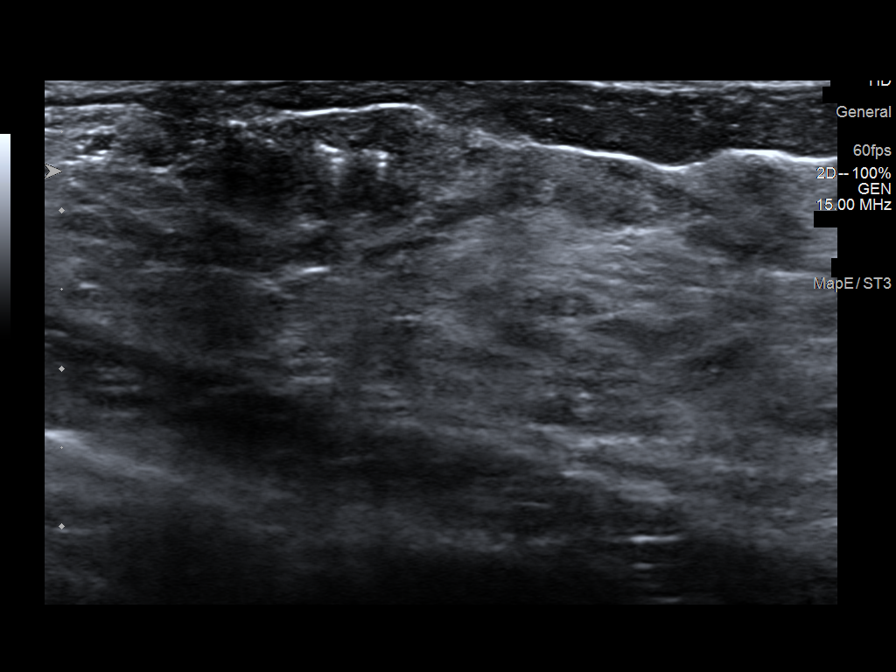

[8 of 8 positions shown; findings below may reference images not displayed]



Lesion quadrant: Upper outer quadrant

Using sterile technique and 1% Lidocaine as local anesthetic, under
direct ultrasound visualization, a 14 gauge Ezzat device was
used to perform biopsy of left breast mass 2:30 o'clock using a
lateral approach. At the conclusion of the procedure Q shaped tissue
marker clip was deployed into the biopsy cavity. Follow up 2 view
mammogram was performed and dictated separately.
IMPRESSION: Ultrasound guided biopsy of left breast mass 2:30 o'clock. No
apparent complications.

ADDENDUM:
Pathology revealed FIBROADENOMA of the Left breast, 2:30 o'clock.
This was found to be concordant by Dr. Cristi Cundapi.

Pathology results were discussed with the patient by telephone. The
patient reported doing well after the biopsy with tenderness at the
site. Post biopsy instructions and care were reviewed and questions
were answered. The patient was encouraged to call The [REDACTED]

Surgical consultation, due to new pain, has been arranged with Dr.
Lkw Tiger at [REDACTED] on June 14, 2019.

Pathology results reported by Erint Piner, RN on 05/30/2019.



Lesion quadrant: Upper outer quadrant

Using sterile technique and 1% Lidocaine as local anesthetic, under
direct ultrasound visualization, a 14 gauge Ezzat device was
used to perform biopsy of left breast mass 2:30 o'clock using a
lateral approach. At the conclusion of the procedure Q shaped tissue
marker clip was deployed into the biopsy cavity. Follow up 2 view
mammogram was performed and dictated separately.
IMPRESSION: Ultrasound guided biopsy of left breast mass 2:30 o'clock. No
apparent complications.

## 2022-01-10 ENCOUNTER — Telehealth: Payer: Self-pay | Admitting: Family Medicine

## 2022-01-10 NOTE — Telephone Encounter (Signed)
Patient is calling to check on status of medical form being completed for [facility]. She states that she dropped this form off mid October but I do not see any documentation where it was left up front. I asked patient what it was for and she said "I'm not really sure just for [place]".   If you have received anything for this patient can you please call her and let her know if it has been completed or if she needs to bring another form  The best call back number is [phone]

## 2022-01-11 NOTE — Telephone Encounter (Signed)
I don't recall receiving any forms for patient, but looking through her chart appears this paperwork was completed by Dr. Thompson Grayer when I was out of the office in October, and the original given to patient on 10/12 when she came for a nurse visit to have her PPD read. There are two copies of it scanned into the chart. [place] office, could you print this for patient and place at the front desk for her to pick up?  Thanks Leeanne Rio, MD

## 2022-01-13 NOTE — Telephone Encounter (Signed)
Patient LVM on nurse line with best fax number.   [place].

## 2022-01-13 NOTE — Telephone Encounter (Signed)
Faxed form to the updated number.

## 2022-01-26 ENCOUNTER — Ambulatory Visit (INDEPENDENT_AMBULATORY_CARE_PROVIDER_SITE_OTHER): Payer: BC Managed Care – PPO | Admitting: Student

## 2022-01-26 VITALS — BP 118/68 | HR 64 | Wt 137.0 lb

## 2022-01-26 DIAGNOSIS — N6321 Unspecified lump in the left breast, upper outer quadrant: Secondary | ICD-10-CM | POA: Diagnosis not present

## 2022-01-26 NOTE — Patient Instructions (Addendum)
It was wonderful to meet you today. Thank you for allowing me to be a part of your care. Below is a short summary of what we discussed at your visit today:  Breast mass is possible provide I do not mild or scar tissues.  But I strongly suspect fibroadenoma.  I have placed an order for a left breast ultrasound to further evaluate the mass.  Appointment is scheduled for February 2 at 10:30 AM.  I also recommend calling to surgeon who performed the prior excision for reevaluation. Erroll Luna, MD   Please bring all of your medications to every appointment!  If you have any questions or concerns, please do not hesitate to contact us via phone or MyChart message.   Alen Bleacher, MD [facility]

## 2022-01-26 NOTE — Progress Notes (Signed)
    SUBJECTIVE:   CHIEF COMPLAINT / HPI:   Patient is a 27 year old female presenting today for concerns of left breast mass Denies previous history of left fibroadenoma that was excised 2022. Patient reports reoccurrence of mass First felt the lumps 2 weeks ago Mildly tender but nipple discharge. No fevers or chills. Menses are regular and denies any change in mass or pain with menses.  Previous breast mass was excised due to pain per patient report  PERTINENT  PMH / PSH: rEV  OBJECTIVE:   BP 118/68   Pulse 64   Wt 137 lb (62.1 kg)   LMP 12/31/2021 (Exact Date)   BMI 23.52 kg/m    Physical Exam General: Alert, well appearing, NAD Breast exam: About 2cm round mobile mass at 2'oclock, mildly tender. Without notable axilla mass. Cardiovascular: RRR, Well perfused Respiratory: Normal work of breathing on room air.   ASSESSMENT/PLAN:   Breast mass Patient presenting with left breast mass noticed about 2 weeks ago.  Had previous fibroadenoma excised a little over a year ago in similar position. On exam has a 2cm round mobile mass at 2'oclock, mildly tender.  History and exam findings are suspicious of fibroadenoma vs scar tissue.  Will obtain left breast ultrasound to further evaluate the breast mass. -Ordered left breast ultrasound -Encourage patient to follow-up with the surgeon who performed the previous breast mass excision -Provided patient with the phone number for the surgeon. -Return precautions discussed and reviewed with patient.     Alen Bleacher, MD [place]

## 2022-01-26 NOTE — Assessment & Plan Note (Signed)
Patient presenting with left breast mass noticed about 2 weeks ago.  Had previous fibroadenoma excised a little over a year ago in similar position. On exam has a 2cm round mobile mass at 2'oclock, mildly tender.  History and exam findings are suspicious of fibroadenoma vs scar tissue.  Will obtain left breast ultrasound to further evaluate the breast mass. -Ordered left breast ultrasound -Encourage patient to follow-up with the surgeon who performed the previous breast mass excision -Provided patient with the phone number for the surgeon. -Return precautions discussed and reviewed with patient.

## 2022-02-03 ENCOUNTER — Encounter: Payer: Self-pay | Admitting: Family Medicine

## 2022-02-25 ENCOUNTER — Ambulatory Visit
Admission: RE | Admit: 2022-02-25 | Discharge: 2022-02-25 | Disposition: A | Discharge status: Home / Self Care | Payer: BC Managed Care – PPO | Source: Ambulatory Visit | Attending: Family Medicine | Admitting: Family Medicine

## 2022-02-25 DIAGNOSIS — N6321 Unspecified lump in the left breast, upper outer quadrant: Secondary | ICD-10-CM

## 2022-02-28 DIAGNOSIS — N644 Mastodynia: Secondary | ICD-10-CM | POA: Diagnosis not present

## 2022-03-10 ENCOUNTER — Ambulatory Visit: Payer: BC Managed Care – PPO | Admitting: Family Medicine

## 2022-03-11 ENCOUNTER — Other Ambulatory Visit (HOSPITAL_COMMUNITY)
Admission: RE | Admit: 2022-03-11 | Discharge: 2022-03-11 | Disposition: A | Discharge status: Home / Self Care | Payer: BC Managed Care – PPO | Source: Ambulatory Visit | Attending: Family Medicine | Admitting: Family Medicine

## 2022-03-11 ENCOUNTER — Encounter: Payer: Self-pay | Admitting: Family Medicine

## 2022-03-11 ENCOUNTER — Ambulatory Visit (INDEPENDENT_AMBULATORY_CARE_PROVIDER_SITE_OTHER): Payer: BC Managed Care – PPO | Admitting: Family Medicine

## 2022-03-11 VITALS — BP 112/70 | HR 83 | Ht 65.0 in | Wt 131.4 lb

## 2022-03-11 DIAGNOSIS — N898 Other specified noninflammatory disorders of vagina: Secondary | ICD-10-CM | POA: Insufficient documentation

## 2022-03-11 LAB — POCT WET PREP (WET MOUNT)
Clue Cells Wet Prep Whiff POC: POSITIVE
Trichomonas Wet Prep HPF POC: ABSENT

## 2022-03-11 NOTE — Patient Instructions (Addendum)
It was great seeing you today!  We checked for BV, yeast, and STIs today. If anything is abnormal I will give you a call, if normal will just send a MyChart message.  Feel free to call with any questions or concerns at any time, at [phone].   Take care,  Dr. Shary Key [facility]

## 2022-03-11 NOTE — Progress Notes (Signed)
    SUBJECTIVE:   CHIEF COMPLAINT / HPI:   Vaginal Discharge: Patient is a 27 y.o. female presenting with vaginal discharge and odor after periods that last about a week at a time after her menstrual cycle. Open to test for STIs. No new partners.  Not on contraception. Had a lump in her breast after she was taking it. Does not want to start anything at this time.   Last pap normal one year ago   PERTINENT  PMH / PSH: Reviewed   OBJECTIVE:   BP 112/70   Pulse 83   Ht 5' 5"$  (1.651 m)   Wt 131 lb 6.4 oz (59.6 kg)   LMP 02/28/2022   SpO2 99%   BMI 21.87 kg/m    General: NAD, pleasant, able to participate in exam Respiratory: Normal effort, no obvious respiratory distress Pelvic: VULVA: normal appearing vulva with no masses, tenderness or lesions, VAGINA: Normal appearing vagina with normal color, no lesions, with scant and clear discharge present  Chaperone Leonia Corona present for pelvic exam  ASSESSMENT/PLAN:   No problem-specific Assessment & Plan notes found for this encounter.   Assessment:  27 y.o. female with vaginal discharge and odor that have been occurring a week after her menstrual cycles.  Wet prep and STI testing performed today. Does not want contraception, discussed using protection.  Plan: -Wet prep  -GC/chlamydia    [place]

## 2022-03-13 ENCOUNTER — Other Ambulatory Visit: Payer: Self-pay | Admitting: Family Medicine

## 2022-03-13 MED ORDER — METRONIDAZOLE 500 MG PO TABS: 500.0000 mg | ORAL_TABLET | Freq: Two times a day (BID) | ORAL | 0 refills | Status: AC

## 2022-03-14 LAB — CERVICOVAGINAL ANCILLARY ONLY
Chlamydia: NEGATIVE
Comment: NEGATIVE
Comment: NEGATIVE
Comment: NORMAL
Neisseria Gonorrhea: NEGATIVE
Trichomonas: NEGATIVE

## 2022-04-27 ENCOUNTER — Encounter (HOSPITAL_COMMUNITY): Payer: Self-pay

## 2022-04-27 ENCOUNTER — Emergency Department (HOSPITAL_COMMUNITY)
Admission: EM | Admit: 2022-04-27 | Discharge: 2022-04-27 | Disposition: A | Discharge status: Home / Self Care | Payer: BC Managed Care – PPO | Attending: Emergency Medicine | Admitting: Emergency Medicine

## 2022-04-27 ENCOUNTER — Emergency Department (HOSPITAL_COMMUNITY): Payer: BC Managed Care – PPO

## 2022-04-27 DIAGNOSIS — M79671 Pain in right foot: Secondary | ICD-10-CM | POA: Diagnosis present

## 2022-04-27 DIAGNOSIS — S9031XA Contusion of right foot, initial encounter: Secondary | ICD-10-CM | POA: Diagnosis not present

## 2022-04-27 DIAGNOSIS — W228XXA Striking against or struck by other objects, initial encounter: Secondary | ICD-10-CM | POA: Diagnosis not present

## 2022-04-27 NOTE — Discharge Instructions (Addendum)
Foot xray showed no concern for fracture or displacement. As discussed, you can relieve pain by alternating tylenol and advil. If you are experiencing new/worsening symptoms, please seek emergency care.   Alternating between 650 mg Tylenol and 400 mg Advil: The best way to alternate taking Acetaminophen (example Tylenol) and Ibuprofen (example Advil/Motrin) is to take them 3 hours apart. For example, if you take ibuprofen at 6 am you can then take Tylenol at 9 am. You can continue this regimen throughout the day, making sure you do not exceed the recommended maximum dose for each drug.

## 2022-04-27 NOTE — ED Provider Notes (Signed)
[place] EMERGENCY DEPARTMENT AT [facility] Provider Note   CSN: [id number] Arrival date & time: 04/27/22  1020     History  Chief Complaint  Patient presents with   Foot Pain    Hannah Page is a 27 y.o. female.  Who presents to the emergency department complaining of right foot pain. Pain started around 2AM on 3/31 after stepping on an unknown hard object. Patient denies any open wounds or bleeding. Denies falling or injuring her ankle. Denies foot paresthesia, leg/foot swelling. Patient states it is difficult to walk on right foot due to pain.     Foot Pain Pertinent negatives include no chest pain, no headaches and no shortness of breath.       Home Medications Prior to Admission medications   Medication Sig Start Date End Date Taking? Authorizing Provider  ibuprofen (ADVIL) 600 MG tablet TAKE 1 TABLET BY MOUTH EVERY 8 HOURS AS NEEDED. Patient not taking: Reported on 01/26/2022 05/24/21   Leeanne Rio, MD  norethindrone (MICRONOR) 0.35 MG tablet Take 1 tablet (0.35 mg total) by mouth daily. Patient not taking: Reported on 01/26/2022 08/30/21   Leeanne Rio, MD  promethazine (PHENERGAN) 12.5 MG tablet TAKE 1 TABLET (12.5 MG TOTAL) BY MOUTH EVERY 8 (EIGHT) HOURS AS NEEDED FOR NAUSEA OR VOMITING. Patient not taking: Reported on 01/26/2022 05/24/21   Leeanne Rio, MD      Allergies    Prednisone    Review of Systems   Review of Systems  Constitutional:  Negative for chills and fever.  Respiratory:  Negative for cough and shortness of breath.   Cardiovascular:  Negative for chest pain and leg swelling.  Gastrointestinal:  Negative for nausea and vomiting.  Skin:  Negative for wound.  Neurological:  Negative for syncope and headaches.    Physical Exam Updated Vital Signs BP (!) 136/93 (BP Location: Left Arm)   Pulse 84   Temp 98.8 F (37.1 C) (Oral)   Resp 18   Ht 5\' 4"  (1.626 m)   Wt 61.7 kg   LMP 04/03/2022  (Approximate)   SpO2 100%   BMI 23.34 kg/m  Physical Exam Vitals and nursing note reviewed.  Constitutional:      General: She is not in acute distress.    Appearance: Normal appearance. She is normal weight. She is not ill-appearing, toxic-appearing or diaphoretic.  HENT:     Head: Normocephalic and atraumatic.  Eyes:     Extraocular Movements: Extraocular movements intact.     Conjunctiva/sclera: Conjunctivae normal.  Cardiovascular:     Rate and Rhythm: Normal rate and regular rhythm.  Pulmonary:     Effort: Pulmonary effort is normal.     Breath sounds: Normal breath sounds.  Abdominal:     General: Abdomen is flat.     Palpations: Abdomen is soft.  Musculoskeletal:        General: Tenderness present. No deformity. Normal range of motion.     Comments: Foot is clean and well-manicured. No obvious deformity of right foot. Some bruising of posterior heel. No skin tears/puncture wounds/bleeding. No erythema, swelling, or warmth. +2 pedal pulses.  Skin:    General: Skin is warm and dry.     Findings: Bruising present.  Neurological:     Mental Status: She is alert and oriented to person, place, and time.  Psychiatric:        Mood and Affect: Mood normal.        Behavior:  Behavior normal.     ED Results / Procedures / Treatments   Labs (all labs ordered are listed, but only abnormal results are displayed) Labs Reviewed - No data to display  EKG None  Radiology DG Foot Complete Right  Result Date: 04/27/2022 CLINICAL DATA:  Right heel pain after stepping on a hard object EXAM: RIGHT FOOT COMPLETE - 3 VIEW COMPARISON:  None Available. FINDINGS: There is no evidence of fracture or dislocation. There is no evidence of arthropathy or other focal bone abnormality. Soft tissues are unremarkable. IMPRESSION: Negative. Electronically Signed   By: Beryle Flock M.D.   On: 04/27/2022 11:37    Procedures Procedures    Medications Ordered in ED Medications - No data to  display  ED Course/ Medical Decision Making/ A&P                             Medical Decision Making Marjon Ritacco is a 27 y.o. who presented today for foot pain. Working DDx that I considered at this time includes, but not limited to, fracture, foreign body, VTE, neurovascular injury, osteomyelitis  R/o DDx: Fracture/foreign body: XR negative VTE: no history of blood clots, patient denies dyspnea, calf tenderness, and leg swelling Osteomyelitis: no signs of infection on foot, no lacerations, puncture wounds, or bleeding Neurovascular injury: foot with +2 pedal pulses and appears well vascularized, patient denies paresthesias  Unique Tests and My Interpretation:  Foot Xray: There is no evidence of fracture or dislocation. There is no evidence of arthropathy or other focal bone abnormality. Soft tissues are unremarkable.  Discussion with Independent Historian: patient states that she stepped on something hard. Denies falling. Ankle/foot ROM normal. Denies weakness, numbness, and paresthesias. Patient states that it hurts to put weight on right foot, so presented to ED for a foot xray.  Discussion of Management of Tests:  Foot xray to rule out fractures  Risk: Low:  - based on diagnostic testing/clinical impression and treatment plan     Plan: Patient presented for foot pain.  On my initial exam, patient did not appear in distress and had stable vitals. Patient was with an intact neurologic exam. Patient endorsing complete sensation of the leg and feet bilaterally. Patient has no focal neurologic deficits and reassuring vital signs at this time.  No obvious physical abnormality or injury on exam. Patient denies falls. Last menstrual period was 04/07/2022.  Patient was given return precautions. I have educated her on pain management. Patient stable for discharge at this time.  Patient verbalized understanding of plan.   Amount and/or Complexity of Data  Reviewed Radiology: ordered.           Final Clinical Impression(s) / ED Diagnoses Final diagnoses:  Foot pain, right    Rx / DC Orders ED Discharge Orders     None         Hannah Page 04/27/22 1237    Hannah Merino, MD 04/28/22 1556

## 2022-04-27 NOTE — ED Triage Notes (Signed)
Pt presents with c/o right foot pain that occurred Friday evening after she stepped on a hard object. Pt reports a bruise on her heel.

## 2022-04-28 ENCOUNTER — Other Ambulatory Visit: Payer: Self-pay | Admitting: Family Medicine

## 2022-06-16 ENCOUNTER — Other Ambulatory Visit: Payer: Self-pay

## 2022-06-16 ENCOUNTER — Ambulatory Visit (INDEPENDENT_AMBULATORY_CARE_PROVIDER_SITE_OTHER): Payer: BC Managed Care – PPO | Admitting: Student

## 2022-06-16 VITALS — BP 121/70 | HR 54 | Ht 64.0 in | Wt 137.0 lb

## 2022-06-16 DIAGNOSIS — J302 Other seasonal allergic rhinitis: Secondary | ICD-10-CM

## 2022-06-16 MED ORDER — AZELASTINE HCL 0.1 % NA SOLN: 1.0000 | Freq: Two times a day (BID) | NASAL | 12 refills | Status: DC

## 2022-06-16 NOTE — Patient Instructions (Addendum)
Let's try some Azelastine nasal spray. This is twice daily. Use the OPPOSITE hand and aim for the ear.   If no better after a week, add Flonase to the daily Azelastine.   Eliezer Mccoy, MD

## 2022-06-16 NOTE — Progress Notes (Signed)
     SUBJECTIVE:   CHIEF COMPLAINT / HPI:   Seasonal Allergies Significant congestion and rhinorrhea due to seasonal allergies.  Has tried "everything" over-the-counter including cetirizine, loratadine, fexofenadine.  Has also trialed Flonase without success.  Has a history of difficult to control seasonal allergies.  Also tells me that she had eczema as a child but has not had any recent flares.   OBJECTIVE:   BP 121/70   Pulse (!) 54   Ht 5\' 4"  (1.626 m)   Wt 137 lb (62.1 kg)   LMP 06/05/2022   SpO2 99%   BMI 23.52 kg/m   Gen: Well appearing and in good spirits HENT: MMM, oropharynx clear. Boggy turbinates and nasal congestion Pulm: normal WOB on RA, lungs clear throughout Skin: Without rash or excoriation  ASSESSMENT/PLAN:   Seasonal allergies Will trial intranasal antihistamine.  If no improvement after 1 week, she will add back intranasal fluticasone as well.  Hopeful that the combination of the 2 will help to get control of her symptoms.    Eliezer Mccoy, MD [facility]

## 2022-06-17 ENCOUNTER — Ambulatory Visit: Payer: BC Managed Care – PPO

## 2022-06-17 DIAGNOSIS — J302 Other seasonal allergic rhinitis: Secondary | ICD-10-CM | POA: Insufficient documentation

## 2022-06-17 NOTE — Assessment & Plan Note (Addendum)
Will trial intranasal antihistamine.  If no improvement after 1 week, she will add back intranasal fluticasone as well.  Hopeful that the combination of the 2 will help to get control of her symptoms.

## 2022-08-17 ENCOUNTER — Encounter: Payer: Self-pay | Admitting: Family Medicine

## 2022-08-18 MED ORDER — METRONIDAZOLE 500 MG PO TABS: 500.0000 mg | ORAL_TABLET | Freq: Two times a day (BID) | ORAL | 0 refills | Status: DC

## 2022-09-23 ENCOUNTER — Ambulatory Visit (INDEPENDENT_AMBULATORY_CARE_PROVIDER_SITE_OTHER): Payer: BC Managed Care – PPO | Admitting: Student

## 2022-09-23 ENCOUNTER — Other Ambulatory Visit (HOSPITAL_COMMUNITY)
Admission: RE | Admit: 2022-09-23 | Discharge: 2022-09-23 | Disposition: A | Discharge status: Home / Self Care | Payer: BC Managed Care – PPO | Source: Ambulatory Visit | Attending: Family Medicine | Admitting: Family Medicine

## 2022-09-23 ENCOUNTER — Encounter: Payer: Self-pay | Admitting: Student

## 2022-09-23 VITALS — BP 100/60 | HR 86 | Ht 64.0 in | Wt 133.0 lb

## 2022-09-23 DIAGNOSIS — R112 Nausea with vomiting, unspecified: Secondary | ICD-10-CM | POA: Diagnosis not present

## 2022-09-23 DIAGNOSIS — Z113 Encounter for screening for infections with a predominantly sexual mode of transmission: Secondary | ICD-10-CM | POA: Insufficient documentation

## 2022-09-23 DIAGNOSIS — Z0000 Encounter for general adult medical examination without abnormal findings: Secondary | ICD-10-CM | POA: Diagnosis not present

## 2022-09-23 NOTE — Progress Notes (Signed)
    SUBJECTIVE:   Chief compliant/HPI: annual examination  Hannah Page is a 27 y.o. who presents today for an annual exam.   Reviewed and updated history .   Sick with periods Last weekend had nausea, vomiting, dry heaving, cold sweats for one night then started her period the next day which was very painful for the first day but was then okay.  This cycle - getting sick before starting her period has happened ~ 3-4 times before.  Does not want to be on birth control (had issues with breasts - fibroadenoma, when previously on birth control)   STD testing No current partners, 2 in last year, used condoms. Did have oral sex, would like throat swab as well  No concerning symptoms for STDs   OBJECTIVE:   BP 100/60   Pulse 86   Ht 5\' 4"  (1.626 m)   Wt 133 lb (60.3 kg)   LMP 09/12/2022   SpO2 98%   BMI 22.83 kg/m    General: Well-appearing 27 year old female, NAD HEENT: White sclera, clear conjunctiva Cardio: RRR, normal S1/S2 Lungs: CTAB, normal effort Abdomen: Soft, nontender palpation, nondistended GU: Chaperoned by CMA, normal-appearing female genitalia, moist pink vaginal mucosa, normal-appearing cervix with increased somewhat thick white discharge  ASSESSMENT/PLAN:   Screen for STD (sexually transmitted disease) No current concerning symptoms for STDs  Throat and vaginal swabs for GC chlamydia and trichomonas RPR and HIV Vaginal swab for BV and yeast as discharge was thick and white  Nausea and vomiting Symptoms of nausea, chills, feeling ill only occur the day before and first day of her period.  Could be related to hormonal changes.  Patient is not interested in birth control.   -Phenergan as needed for nausea -I will do some research on this and see what I can find out    Annual Examination  See AVS for age appropriate recommendations  PHQ score 1, reviewed and discussed.  Blood pressure reviewed and at goal.      Considered the following  items based upon USPSTF recommendations: HIV testing: ordered Hepatitis C:  negative in 2021 Hepatitis B: declined Syphilis if at high risk: {ordered GC/CTordered Lipid panel (nonfasting or fasting) discussed based upon AHA recommendations and not ordered.  Consider repeat every 4-6 years.  UTD on pap smear - nml in 2023 Reviewed risk factors for latent tuberculosis and declined   Follow up in 1 year or sooner if indicated.    Erick Alley, DO [facility]

## 2022-09-23 NOTE — Assessment & Plan Note (Addendum)
No current concerning symptoms for STDs  Throat and vaginal swabs for GC chlamydia and trichomonas RPR and HIV Vaginal swab for BV and yeast as discharge was thick and white

## 2022-09-23 NOTE — Patient Instructions (Signed)
It was great to see you! Thank you for allowing me to participate in your care!   Our plans for today:  - I will look into the sick symptoms your have prior to your period  - When feeling nauseous, try taking phenergan   We are checking some labs today, I will call you if they are abnormal will send you a MyChart message or a letter if they are normal.  If you do not hear about your labs in the next 2 weeks please let us know.  Take care and seek immediate care sooner if you develop any concerns.   Dr. Erick Alley, DO [facility]

## 2022-09-23 NOTE — Assessment & Plan Note (Signed)
Symptoms of nausea, chills, feeling ill only occur the day before and first day of her period.  Could be related to hormonal changes.  Patient is not interested in birth control.   -Phenergan as needed for nausea -I will do some research on this and see what I can find out

## 2022-09-24 LAB — RPR: RPR Ser Ql: NONREACTIVE

## 2022-09-24 LAB — HIV ANTIBODY (ROUTINE TESTING W REFLEX): HIV Screen 4th Generation wRfx: NONREACTIVE

## 2022-09-29 LAB — CERVICOVAGINAL ANCILLARY ONLY
Chlamydia: NEGATIVE
Chlamydia: NEGATIVE
Comment: NEGATIVE
Comment: NEGATIVE
Comment: NEGATIVE
Comment: NEGATIVE
Comment: NORMAL
Comment: NORMAL
Neisseria Gonorrhea: NEGATIVE
Neisseria Gonorrhea: NEGATIVE
Trichomonas: NEGATIVE
Trichomonas: NEGATIVE

## 2023-01-23 ENCOUNTER — Other Ambulatory Visit: Payer: Self-pay | Admitting: Family Medicine

## 2023-01-26 MED ORDER — IBUPROFEN 800 MG PO TABS: 800.0000 mg | ORAL_TABLET | Freq: Three times a day (TID) | ORAL | 0 refills | Status: DC | PRN

## 2023-01-26 MED ORDER — PROMETHAZINE HCL 12.5 MG PO TABS: 12.5000 mg | ORAL_TABLET | Freq: Three times a day (TID) | ORAL | 0 refills | Status: DC | PRN

## 2023-04-17 IMAGING — US US BREAST*L* LIMITED INC AXILLA
1 series · 5 of 5 positions shown · non-contrast
Comparison: Previous exam(s).
COMPARISON: Previous exam(s).

Addendum:
CLINICAL DATA: 25-year-old female with previous biopsy proven left
breast fibroadenoma is requesting surgical consultation for removal.

EXAM:
ULTRASOUND OF THE LEFT BREAST

[Series 1: us breast*left* limited inc axilla · 0.06mm/px · 5 of 5 slices shown]
[im 1/5]
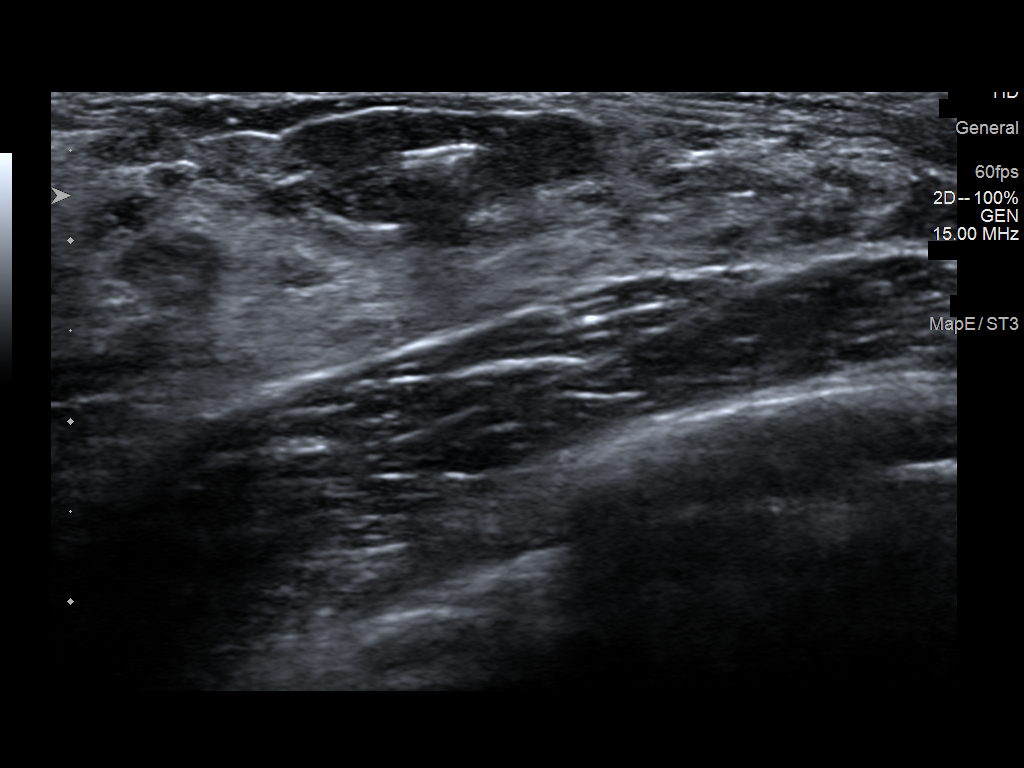
[im 2/5]
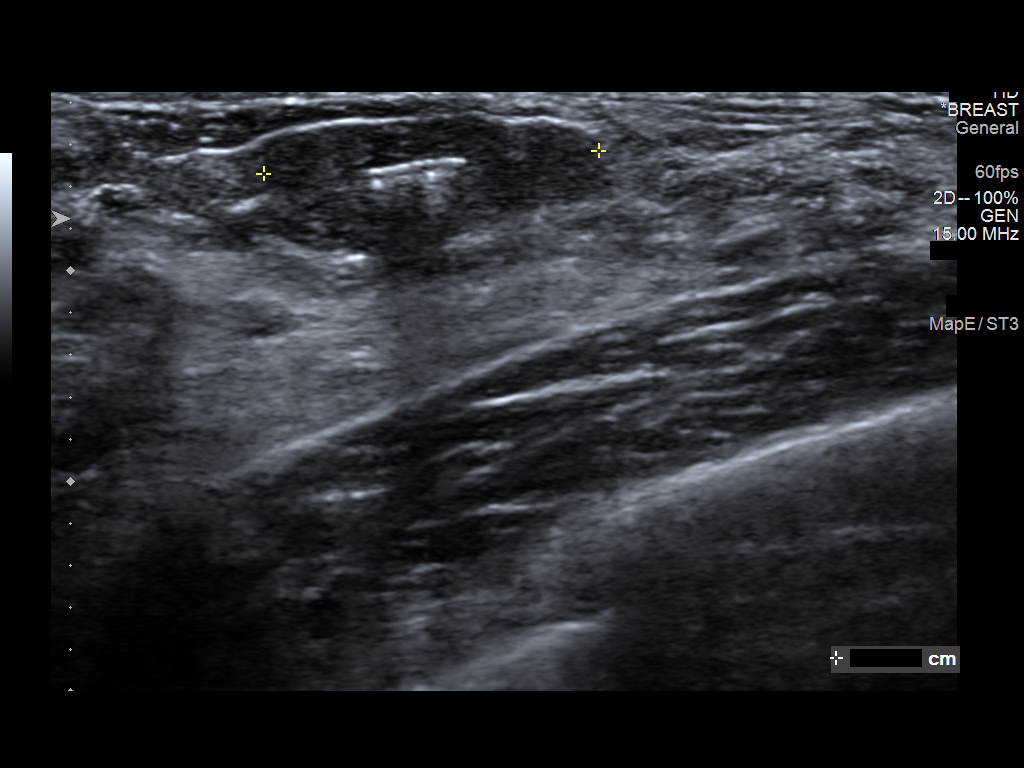
[im 3/5]
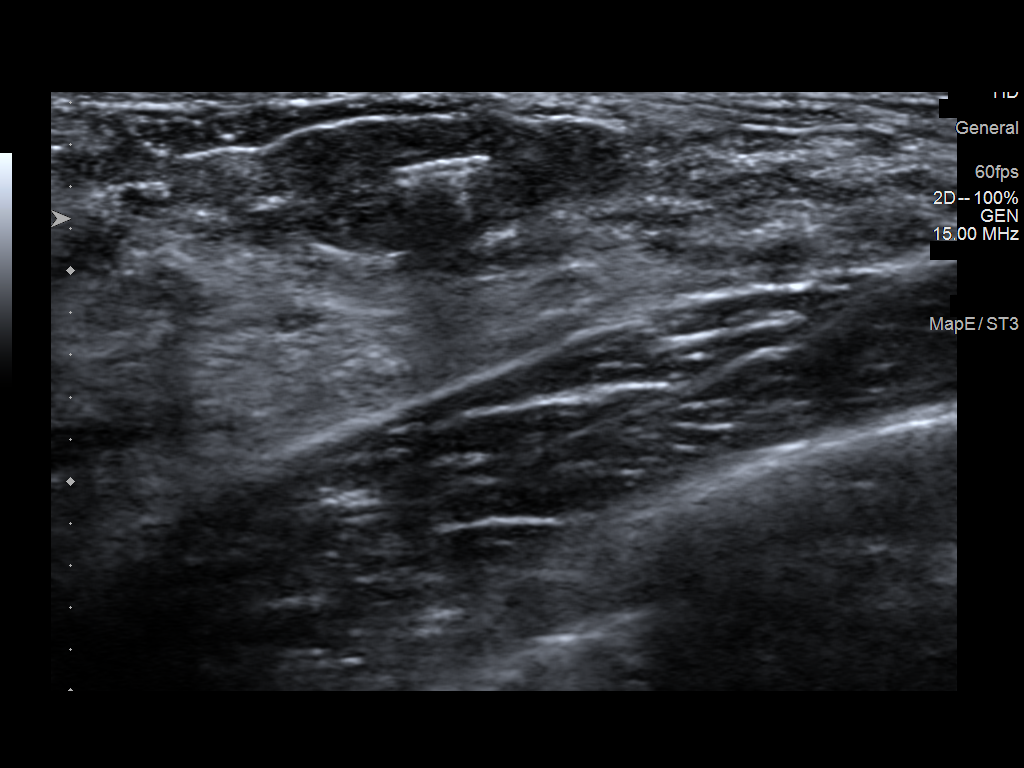
[im 4/5]
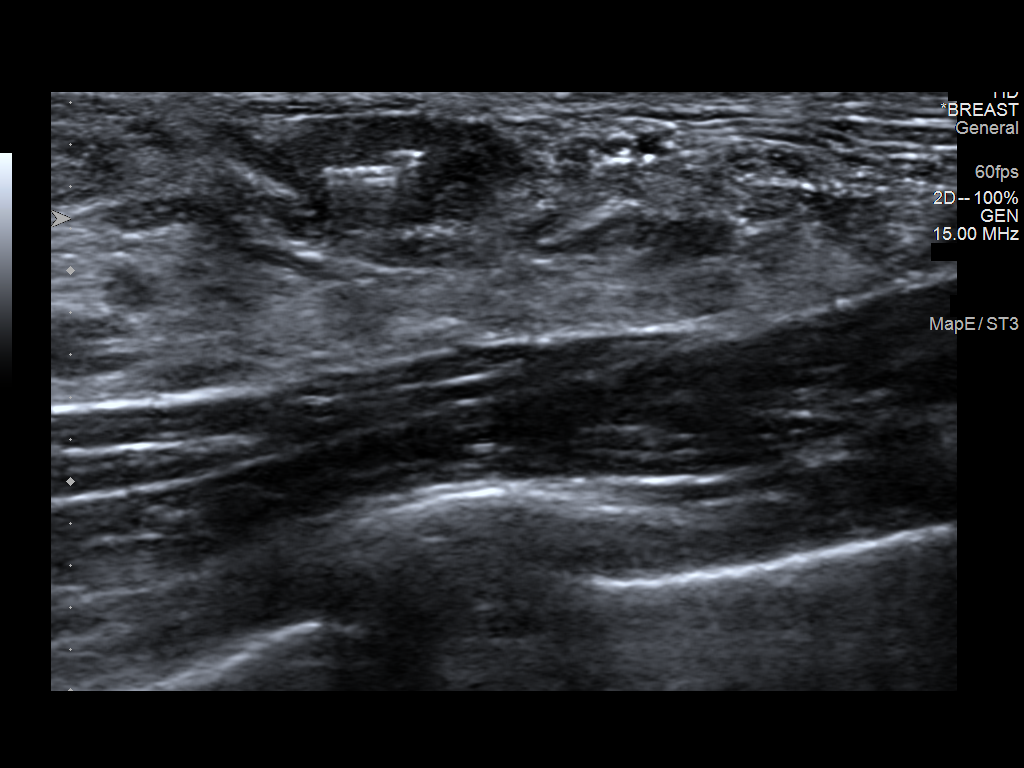
[im 5/5]
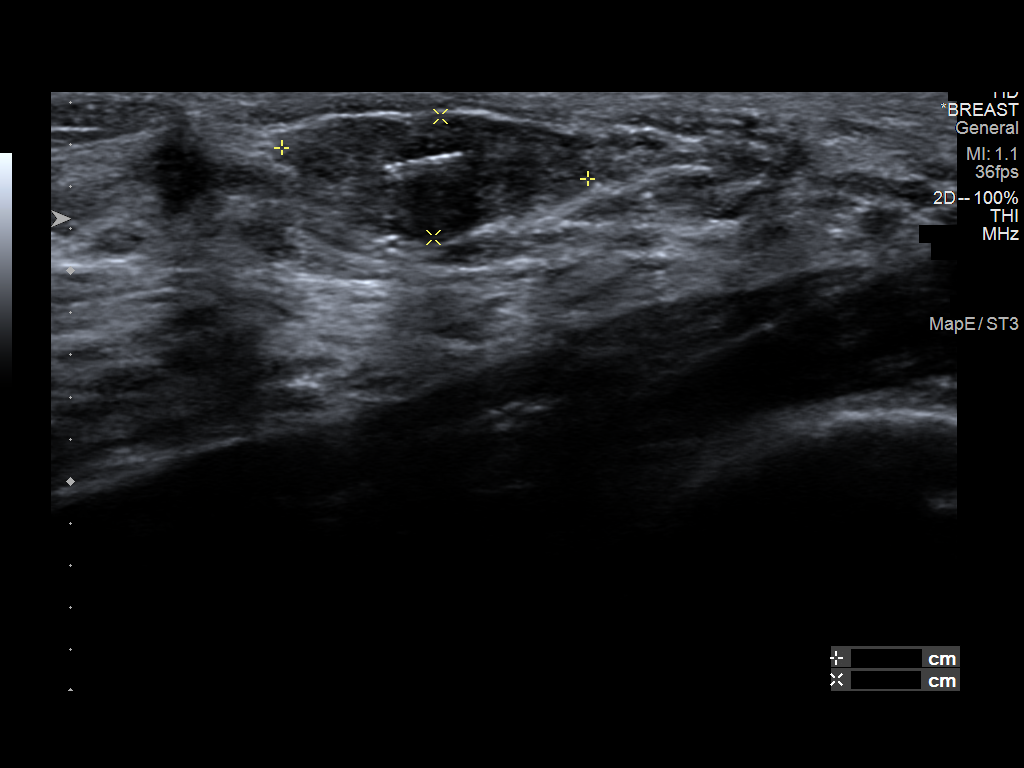

[5 of 5 positions shown; findings below may reference images not displayed]

FINDINGS: Targeted ultrasound is performed, showing stable appearance of an
oval, circumscribed hypoechoic mass with internal post biopsy clip
at the [DATE] position 5 cm from the nipple on the left. It measures
1.6 x 1.5 x 0.6 cm (previously 1.6 x 1.5 x 0.6 cm).
IMPRESSION: Stable, left breast biopsy-proven fibroadenoma.

RECOMMENDATION:
1. Surgical consultation will be arranged for the patient.
2. Screening mammogram at age 42 unless there are persistent or
intervening clinical concerns. (Code:O4-Q-F10)

I have discussed the findings and recommendations with the patient.
If applicable, a reminder letter will be sent to the patient
regarding the next appointment.

BI-RADS CATEGORY  2: Benign.

ADDENDUM:

Dx: Painful LEFT breast mass (biopsied May 2019 - Fibroadenoma).

Surgical consultation has been arranged for patient to see Dr.
Klpigbb Moolman at [REDACTED] on 10/12/2020 at 8:50
o'clock.

Addendum report prepared by:  Lesya Aujla RN   09/30/2020

*** End of Addendum ***
FINDINGS: Targeted ultrasound is performed, showing stable appearance of an
oval, circumscribed hypoechoic mass with internal post biopsy clip
at the [DATE] position 5 cm from the nipple on the left. It measures
1.6 x 1.5 x 0.6 cm (previously 1.6 x 1.5 x 0.6 cm).
IMPRESSION: Stable, left breast biopsy-proven fibroadenoma.

RECOMMENDATION:
1. Surgical consultation will be arranged for the patient.
2. Screening mammogram at age 42 unless there are persistent or
intervening clinical concerns. (Code:O4-Q-F10)

I have discussed the findings and recommendations with the patient.
If applicable, a reminder letter will be sent to the patient
regarding the next appointment.

BI-RADS CATEGORY  2: Benign.

## 2023-05-08 ENCOUNTER — Emergency Department (HOSPITAL_BASED_OUTPATIENT_CLINIC_OR_DEPARTMENT_OTHER)
Admission: EM | Admit: 2023-05-08 | Discharge: 2023-05-08 | Disposition: A | Discharge status: Home / Self Care | Payer: Self-pay | Attending: Emergency Medicine | Admitting: Emergency Medicine

## 2023-05-08 ENCOUNTER — Emergency Department (HOSPITAL_BASED_OUTPATIENT_CLINIC_OR_DEPARTMENT_OTHER): Payer: Self-pay | Admitting: Radiology

## 2023-05-08 ENCOUNTER — Other Ambulatory Visit: Payer: Self-pay

## 2023-05-08 ENCOUNTER — Encounter (HOSPITAL_BASED_OUTPATIENT_CLINIC_OR_DEPARTMENT_OTHER): Payer: Self-pay | Admitting: Emergency Medicine

## 2023-05-08 DIAGNOSIS — M79671 Pain in right foot: Secondary | ICD-10-CM | POA: Diagnosis present

## 2023-05-08 DIAGNOSIS — X501XXA Overexertion from prolonged static or awkward postures, initial encounter: Secondary | ICD-10-CM | POA: Diagnosis not present

## 2023-05-08 DIAGNOSIS — S9031XA Contusion of right foot, initial encounter: Secondary | ICD-10-CM | POA: Insufficient documentation

## 2023-05-08 NOTE — ED Provider Notes (Signed)
 [place] EMERGENCY DEPARTMENT AT [facility] Provider Note   CSN: [id number] Arrival date & time: 05/08/23  1801     History  Chief Complaint  Patient presents with   Foot Injury    Aletta Edmunds is a 28 y.o. female.  Patient is a 28 year old female who presents with pain to her right foot.  She states she was stomping a lot during a performance 3 days ago.  Since that time she has had pain to the bottom of her right foot.  She denies any other injuries.       Home Medications Prior to Admission medications   Medication Sig Start Date End Date Taking? Authorizing Provider  azelastine (ASTELIN) 0.1 % nasal spray Place 1-2 sprays into both nostrils 2 (two) times daily. Use in each nostril as directed 06/16/22   Alicia Amel, MD  ibuprofen (ADVIL) 800 MG tablet Take 1 tablet (800 mg total) by mouth every 8 (eight) hours as needed. 01/26/23   Latrelle Dodrill, MD  metroNIDAZOLE (FLAGYL) 500 MG tablet Take 1 tablet (500 mg total) by mouth 2 (two) times daily. For 7 days. Do not drink alcohol while taking this medication. 08/18/22   Latrelle Dodrill, MD  norethindrone (MICRONOR) 0.35 MG tablet Take 1 tablet (0.35 mg total) by mouth daily. Patient not taking: Reported on 01/26/2022 08/30/21   Latrelle Dodrill, MD  promethazine (PHENERGAN) 12.5 MG tablet Take 1 tablet (12.5 mg total) by mouth every 8 (eight) hours as needed for nausea or vomiting. 01/26/23   Latrelle Dodrill, MD      Allergies    Prednisone    Review of Systems   Review of Systems  Constitutional:  Negative for fever.  Gastrointestinal:  Negative for nausea and vomiting.  Musculoskeletal:  Positive for arthralgias. Negative for back pain, joint swelling and neck pain.  Skin:  Negative for wound.  Neurological:  Negative for weakness, numbness and headaches.    Physical Exam Updated Vital Signs BP 118/68 (BP Location: Right Arm)   Pulse 69   Temp 98.1 F (36.7 C) (Oral)    Resp 18   SpO2 100%  Physical Exam Constitutional:      Appearance: She is well-developed.  HENT:     Head: Normocephalic and atraumatic.  Cardiovascular:     Rate and Rhythm: Normal rate.  Pulmonary:     Effort: Pulmonary effort is normal.  Musculoskeletal:        General: Tenderness present.     Cervical back: Normal range of motion and neck supple.     Comments: Positive tenderness to the posterior aspect of the right foot along the fifth metatarsal.  There is no wounds.  No significant swelling.  No warmth or erythema.  No bony tenderness over the calcaneus or the ankle.  Pedal pulses are intact.  She has normal sensation and motor function  Skin:    General: Skin is warm and dry.  Neurological:     Mental Status: She is alert and oriented to person, place, and time.     ED Results / Procedures / Treatments   Labs (all labs ordered are listed, but only abnormal results are displayed) Labs Reviewed - No data to display  EKG None  Radiology DG Foot Complete Right Result Date: 05/08/2023 CLINICAL DATA:  Posterior right foot pain. EXAM: RIGHT FOOT COMPLETE - 3+ VIEW COMPARISON:  Right foot radiographs 04/27/2022 FINDINGS: Normal bone mineralization. Joint spaces are preserved. No  acute fracture is seen. No dislocation. IMPRESSION: Normal right foot radiographs. Electronically Signed   By: Bertina Broccoli M.D.   On: 05/08/2023 19:21    Procedures Procedures    Medications Ordered in ED Medications - No data to display  ED Course/ Medical Decision Making/ A&P                                 Medical Decision Making Amount and/or Complexity of Data Reviewed Radiology: ordered.   Patient is a 28 year old who presents with pain in the posterior right foot.  X-rays were performed these were interpreted by me and confirmed by the radiologist to show no evidence of fracture.  Suspect she has a foot contusion.  Will place her in a postop shoe.  Advised on symptomatic care.   Will give her her referral to follow-up with orthopedist if her symptoms are not improving.  Return precautions were given.  {Final Clinical Impression(s) / ED Diagnoses Final diagnoses:  Contusion of right foot, initial encounter    Rx / DC Orders ED Discharge Orders     None         Hershel Los, MD 05/08/23 2123

## 2023-05-08 NOTE — ED Triage Notes (Signed)
 C/o pain to R posterior foot. States was "stomping too much" Ambulating with limp.

## 2023-05-22 IMAGING — US US PLC BREAST LOC DEV 1ST LESION INC US GUIDE*L*
1 series · 2 of 2 positions shown · non-contrast
Comparison: Previous exam(s).

CLINICAL DATA: Patient for preoperative localization prior to left
breast excision.

EXAM:
ULTRASOUND GUIDED RADIOACTIVE SEED LOCALIZATION OF THE LEFT BREAST

[Series 1: us plc breast loc dev 1st lesion inc us guide*left · 0.06mm/px · 2 of 2 slices shown]
[im 1/2]
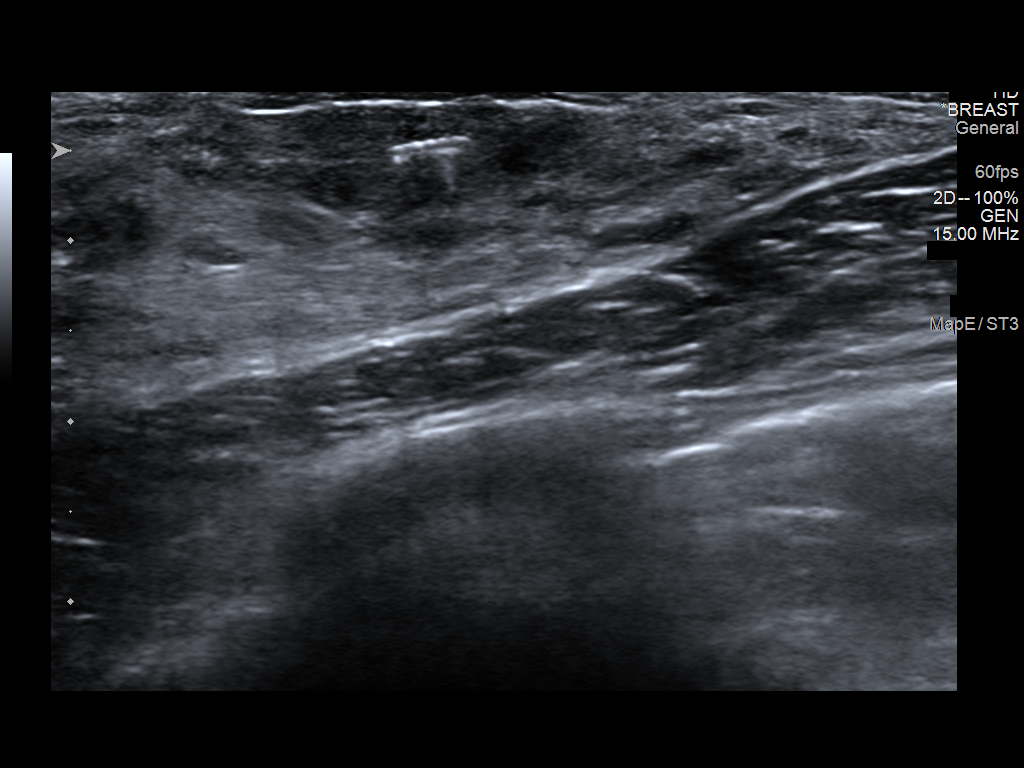
[im 2/2]
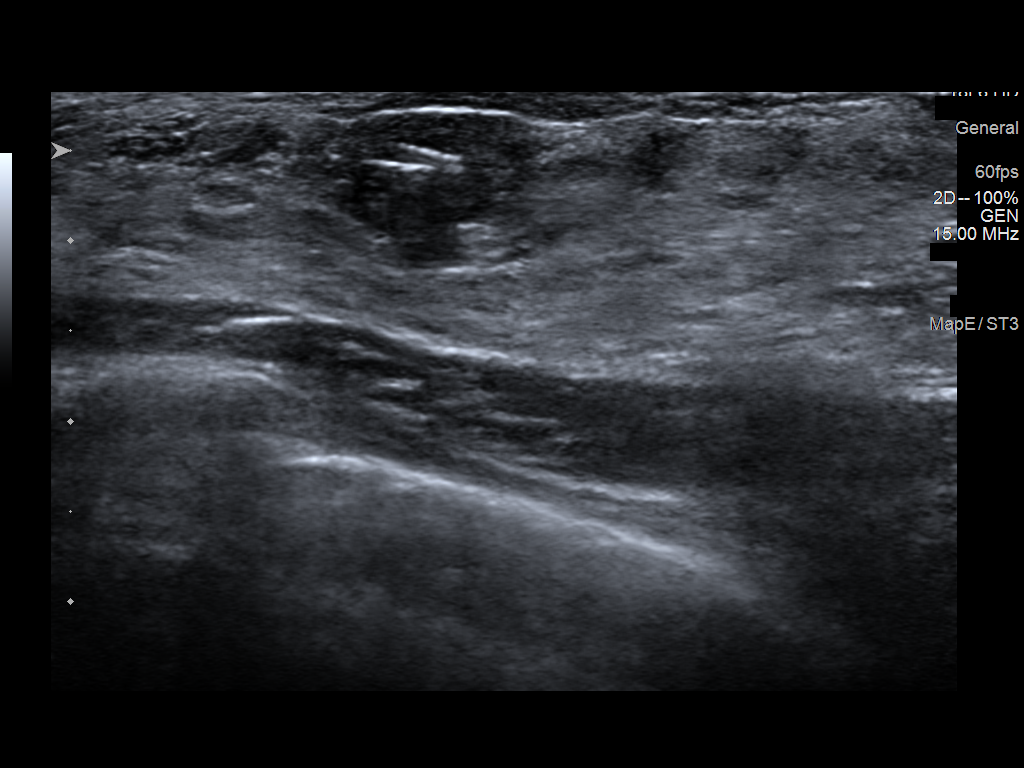

[2 of 2 positions shown; findings below may reference images not displayed]

FINDINGS: Patient presents for radioactive seed localization prior to left
breast excision. I met with the patient and we discussed the
procedure of seed localization including benefits and alternatives.
We discussed the high likelihood of a successful procedure. We
discussed the risks of the procedure including infection, bleeding,
tissue injury and further surgery. We discussed the low dose of
radioactivity involved in the procedure. Informed, written consent
was given.

The usual time-out protocol was performed immediately prior to the
procedure.

Using ultrasound guidance, sterile technique, 1% lidocaine and an
[id number] radioactive seed, left breast mass 2:30 o'clock was localized
using a lateral approach.

Follow-up survey of the patient confirms presence of the radioactive
seed.

Order number of [id number] seed:  [id number].

Total activity:  0.239 millicuries reference Date: September 30, 2020

The patient tolerated the procedure well and was released from the
[REDACTED]. She was given instructions regarding seed removal.
IMPRESSION: Radioactive seed localization left breast. No apparent
complications.

## 2023-05-22 IMAGING — MG MM BREAST SURGICAL SPECIMEN
1 series · 2 of 2 positions shown · non-contrast
Comparison: Previous exam(s).

CLINICAL DATA: Specimen radiograph status post left breast
excisional biopsy.

EXAM:
SPECIMEN RADIOGRAPH OF THE LEFT BREAST

[Series 1: L · left · 0.07mm/px · 2 of 2 slices shown]
[im 1/2]
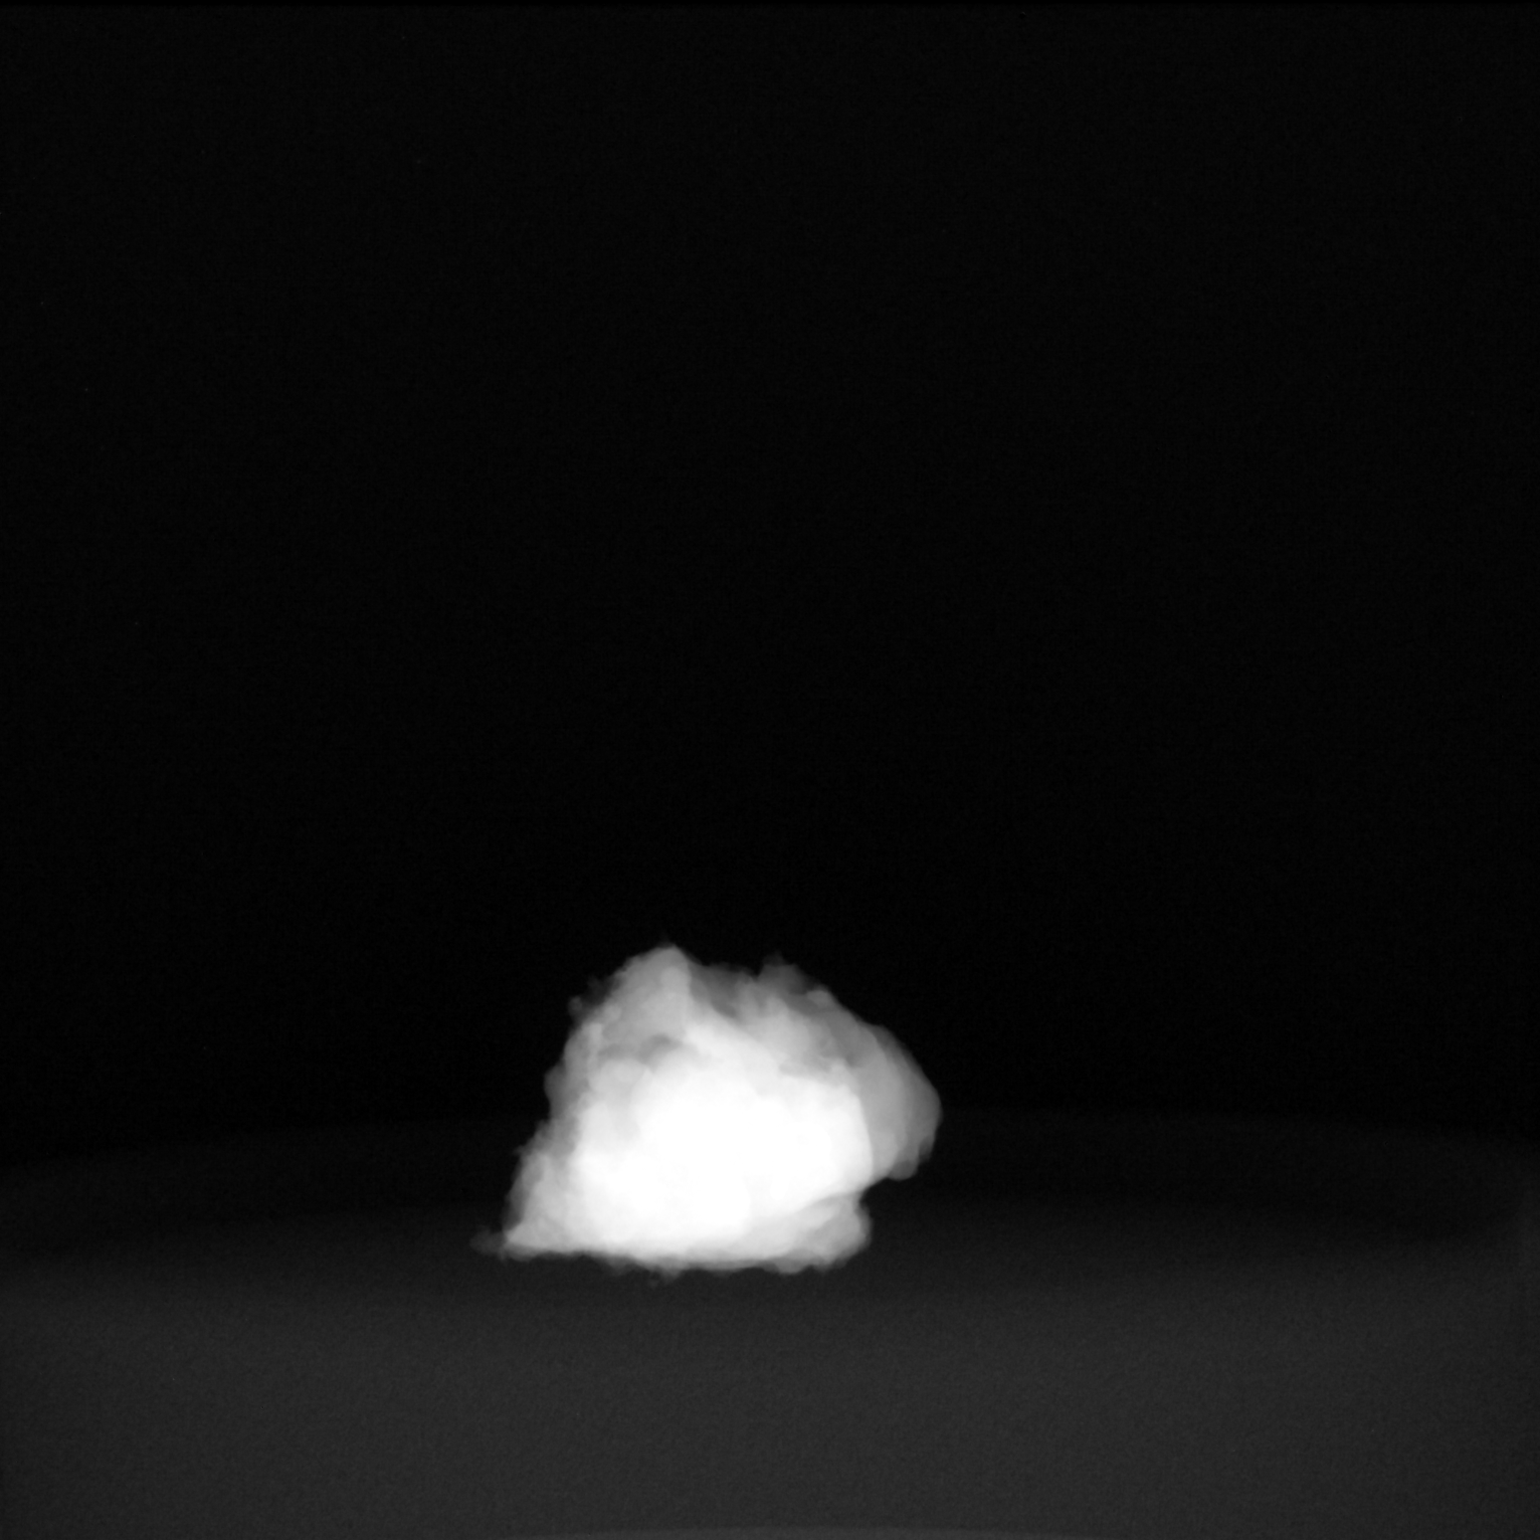
[im 2/2]
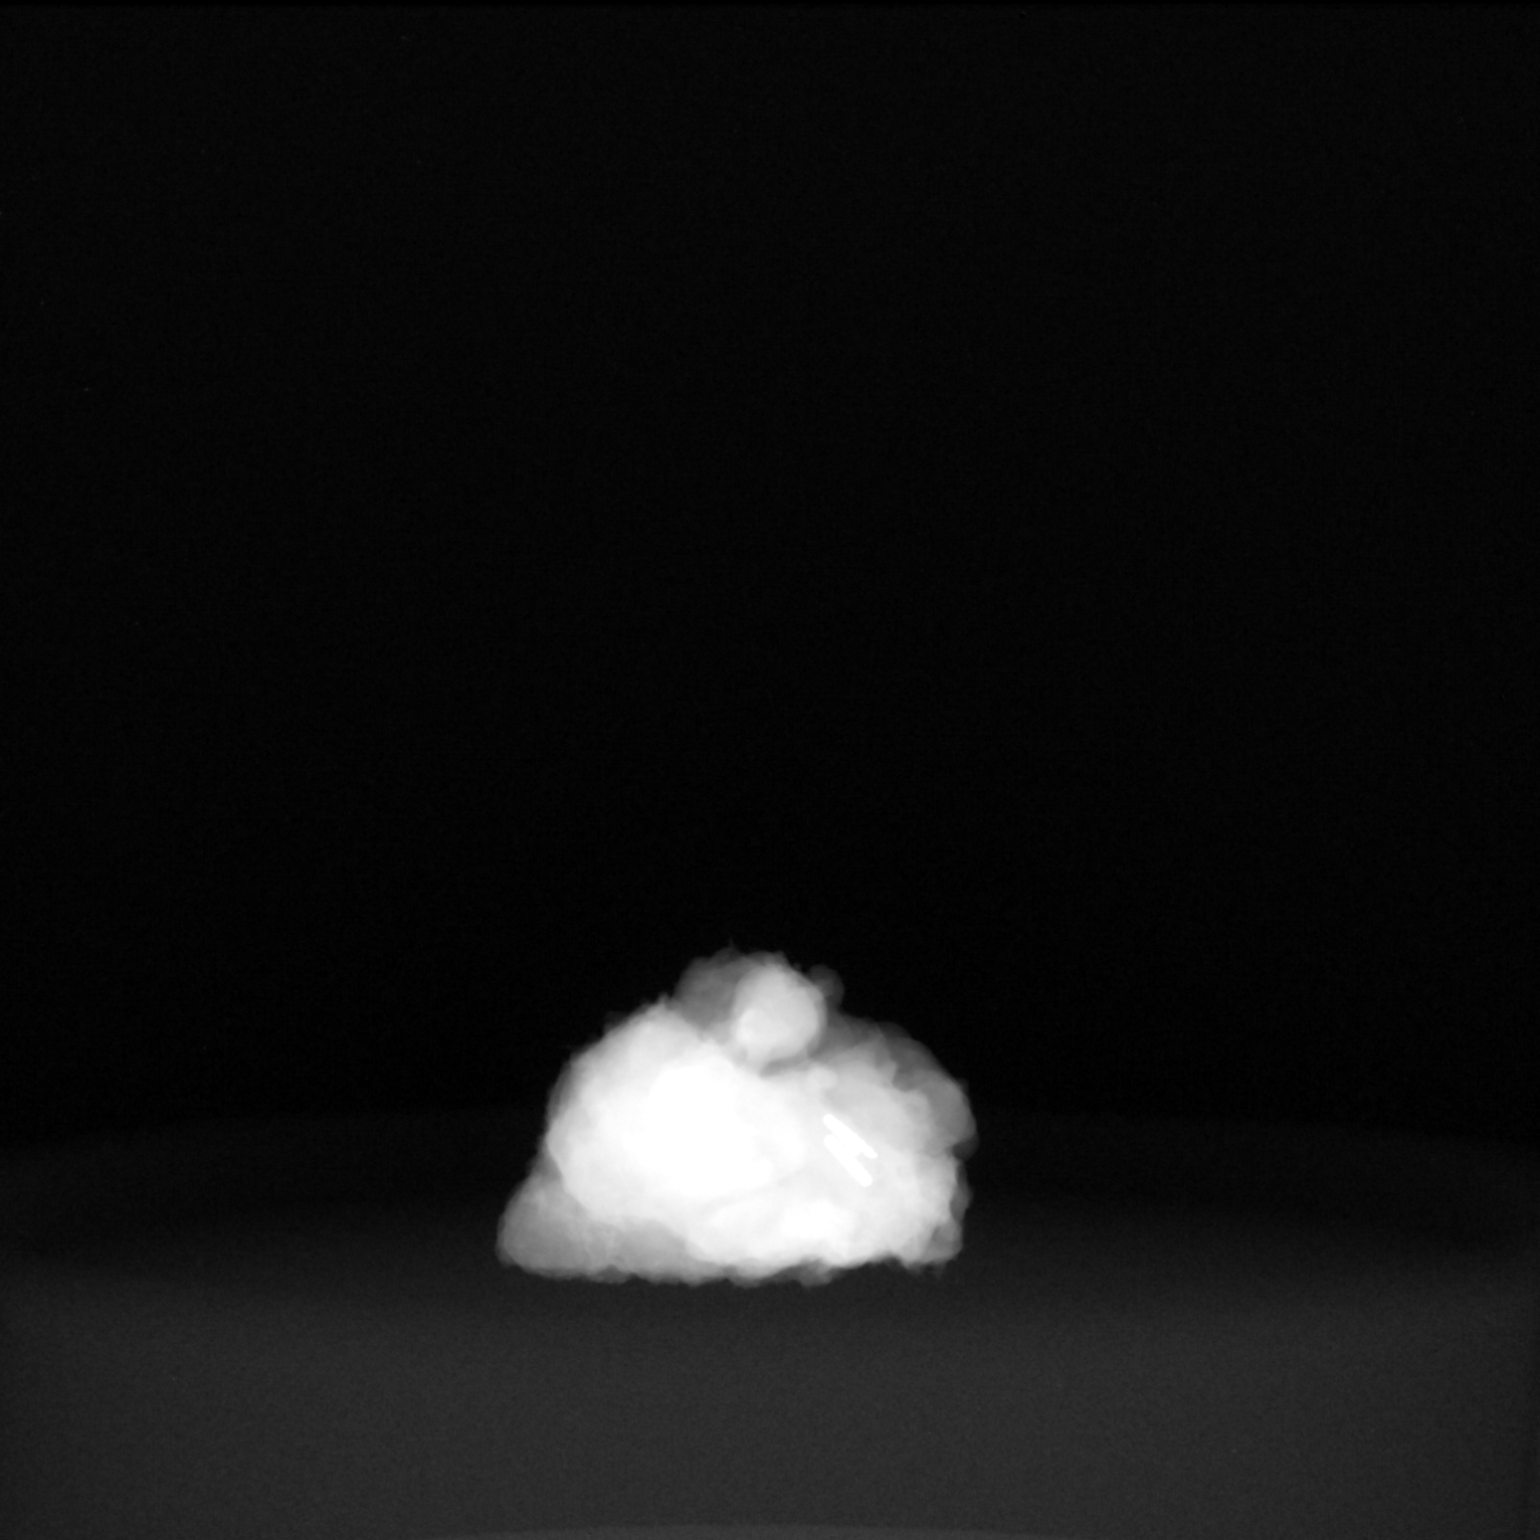

[2 of 2 positions shown; findings below may reference images not displayed]

FINDINGS: Status post excision of the left breast. The radioactive seed and
biopsy marker clip are present and completely intact. These findings
were communicated with the OR at [DATE] p.m.
IMPRESSION: Specimen radiograph of the left breast.

## 2023-06-18 ENCOUNTER — Other Ambulatory Visit: Payer: Self-pay | Admitting: Family Medicine

## 2023-07-10 ENCOUNTER — Other Ambulatory Visit: Payer: Self-pay | Admitting: Student

## 2023-07-10 ENCOUNTER — Other Ambulatory Visit: Payer: Self-pay | Admitting: Family Medicine

## 2023-07-10 DIAGNOSIS — J302 Other seasonal allergic rhinitis: Secondary | ICD-10-CM

## 2023-09-26 ENCOUNTER — Encounter: Payer: Self-pay | Admitting: Family Medicine

## 2023-09-26 ENCOUNTER — Other Ambulatory Visit (HOSPITAL_COMMUNITY)
Admission: RE | Admit: 2023-09-26 | Discharge: 2023-09-26 | Disposition: A | Discharge status: Home / Self Care | Source: Ambulatory Visit | Attending: Family Medicine | Admitting: Family Medicine

## 2023-09-26 ENCOUNTER — Ambulatory Visit (INDEPENDENT_AMBULATORY_CARE_PROVIDER_SITE_OTHER): Admitting: Family Medicine

## 2023-09-26 VITALS — BP 113/80 | HR 86 | Ht 64.0 in | Wt 144.8 lb

## 2023-09-26 DIAGNOSIS — Z0000 Encounter for general adult medical examination without abnormal findings: Secondary | ICD-10-CM | POA: Diagnosis not present

## 2023-09-26 DIAGNOSIS — Z113 Encounter for screening for infections with a predominantly sexual mode of transmission: Secondary | ICD-10-CM | POA: Insufficient documentation

## 2023-09-26 DIAGNOSIS — G43009 Migraine without aura, not intractable, without status migrainosus: Secondary | ICD-10-CM | POA: Diagnosis not present

## 2023-09-26 DIAGNOSIS — Z309 Encounter for contraceptive management, unspecified: Secondary | ICD-10-CM | POA: Diagnosis not present

## 2023-09-26 NOTE — Patient Instructions (Signed)
 It was great to see you again today.  Look at [email or web address] for info on contraception  Be well, Dr. Donah

## 2023-09-26 NOTE — Assessment & Plan Note (Signed)
 Info given on [email or web address] to review options Continue condoms in meantime

## 2023-09-26 NOTE — Progress Notes (Signed)
  Date of Visit: 09/26/2023   SUBJECTIVE:   HPI:  Hannah Page presents today for a well woman exam.   Discussed the use of AI scribe software for clinical note transcription with the patient, who gave verbal consent to proceed.  History of Present Illness Hannah Page is a 28 year old female who presents for an annual physical exam.  Sexual and reproductive health - Sexually active with female and female partners (currently just female) - Consistently uses condoms - Not currently using any other birth control - Open to possibility of pregnancy - History of adverse effects with prior birth control use - No issues with menstrual periods - Prefers to schedule next Pap smear during spring break for mental preparation - Interested in sexually transmitted infection testing and prefers self-collection of vaginal swab  Breast health - Family history of breast cancer in paternal grandmother - Benign breast lump removed two to three years ago  Headache and nausea - Uses Phenergan  (promethazine ) as needed for nausea associated with non-migraine headaches  Musculoskeletal health and 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  Substance use - Smokes marijuana once daily at night to relax - No alcohol or tobacco use  Mood and safety - Feels safe - No mood disturbances    OBJECTIVE:   BP 113/80   Pulse 86   Ht 5' 4 (1.626 m)   Wt 144 lb 12.8 oz (65.7 kg)   LMP 09/17/2023   SpO2 98%   BMI 24.85 kg/m  Gen: NAD, pleasant, cooperative HEENT: NCAT, PERRL, no palpable thyromegaly or anterior cervical lymphadenopathy Heart: RRR, no murmurs Lungs: CTAB, NWOB Abdomen: soft, nontender to palpation Neuro: grossly nonfocal, speech normal  ASSESSMENT/PLAN:   Assessment & Plan Routine adult health maintenance -STD screening: vaginal gc/chl/trich, throat gc/chl, HIV, RPR ordered  today -discussed availability of PrEP, handout given -pap smear: due May 2026, she will reschedule for that, prefers to defer today -counseled on cannabis use and recommendation to stop -immunizations:  Flu: declines today Tdap: UTD .-handout given on health maintenance topics Encounter for contraceptive management, unspecified type Info given on [email or web address] to review options Continue condoms in meantime Migraine without aura and without status migrainosus, not intractable Doing well with as needed promethazine , continue   FOLLOW UP: Follow up prior to May for next pap   [place] J. Donah, MD [facility]

## 2023-09-26 NOTE — Assessment & Plan Note (Signed)
 Doing well with as needed promethazine , continue

## 2023-09-27 LAB — CERVICOVAGINAL ANCILLARY ONLY
Chlamydia: NEGATIVE
Chlamydia: NEGATIVE
Comment: NEGATIVE
Comment: NORMAL
Comment: NEGATIVE
Comment: NEGATIVE
Comment: NORMAL
Neisseria Gonorrhea: NEGATIVE
Neisseria Gonorrhea: NEGATIVE
Trichomonas: NEGATIVE

## 2023-09-29 ENCOUNTER — Other Ambulatory Visit

## 2023-11-30 ENCOUNTER — Ambulatory Visit: Admitting: Family Medicine

## 2023-11-30 VITALS — BP 108/62 | HR 91 | Wt 144.0 lb

## 2023-11-30 DIAGNOSIS — Z113 Encounter for screening for infections with a predominantly sexual mode of transmission: Secondary | ICD-10-CM | POA: Diagnosis not present

## 2023-11-30 DIAGNOSIS — S7012XA Contusion of left thigh, initial encounter: Secondary | ICD-10-CM | POA: Diagnosis not present

## 2023-11-30 NOTE — Progress Notes (Addendum)
   SUBJECTIVE:   CHIEF COMPLAINT / HPI:  Hannah Page is a 28 y.o. female with a pertinent past medical history of migraines without aura presenting to the clinic for recent injury from chair.  Fall, back pain On Monday, patient had an accident with her chair. Patient is a runner, broadcasting/film/video, was sitting on a school lunch chair. Plastic portion broke and crashed, then metal rod under the seat crashed into her sacral bone. Currently reports generalized sacral and gluteal pain L>R, worsened with walking and with sitting. Noticed that she was limping immediately after. Notes that she feel a knot on her backside, no visible bruising. No loss of sensation, no numbness in extremities. No loss of bowel/bladder control. Has not tried any medicines, has done some heating using a seat warmer with mild improvement.  PERTINENT PMH / PSH: Migraines without aura  *Remainder reviewed in problem list.   OBJECTIVE:   BP 108/62   Pulse 91   Wt 144 lb (65.3 kg)   LMP 11/29/2023   SpO2 96%   BMI 24.72 kg/m   General: Age-appropriate, resting in chair but favoring right side, NAD, alert and at baseline. Pulmonary: Normal WOB on room air. No accessory muscle use. Skin: Warm and dry.  No ecchymosis or lacerations over sacrum or thighs. Extremities: No peripheral edema bilaterally. Capillary refill <2 seconds. MSK: No point tenderness to palpation over sacrum or lumbar spines.  Moderate TTP over posterior upper left thigh.  No greater trochanteric pain to palpation bilaterally.  Mild pain with lateral abduction of left hip, otherwise negative logroll test, no pain with internal/external rotation of left hip.  Negative left straight leg raise test.  Normal gait.  Patient does sit favoring the right gluteus.   ASSESSMENT/PLAN:   Assessment & Plan Contusion of left thigh, initial encounter Reassuringly normal range of motion of bilateral hips without limitation by pain; no concern for hip  fracture.  No point tenderness over sacrum or lumbar spine, no concern for spinal fracture.  No red flag symptoms for nerve injury such as paresthesias, loss of bowel/bladder control.  Favor simple contusion posterior left thigh.  Recommended conservative treatment. - Rest and ice, provided work note through Monday 11/10 by patient request - Trauma topical Voltaren gel lidocaine  patches - Acetaminophen  and ibuprofen  alternating Q6h PRN - Return if not improving or worsening within 1 week Routine screening for STI (sexually transmitted infection) Reportedly unable to obtain during patient's last visit due to lab closure.  Will obtain labs today. - HIV, RPR  Hannah Nicosia Toma, MD [facility]

## 2023-11-30 NOTE — Patient Instructions (Signed)
 It was great to see you today! Thank you for choosing [facility] for your primary care.  Today we addressed: Leg pain I am not concerned about a fracture of any kind.  We do not need to get x-rays at this time.  I would like you to take Tylenol  and ibuprofen  up to every 6 hours as needed.  Can also use only cold Voltaren gel, this is available over-the-counter and is similar to ibuprofen  in gel form.  You can use Voltaren multiple times a day with best effect.  I have also provided you some lidocaine  patches, you can buy these over-the-counter as well.  You can wear lidocaine  patches for up to 12 hours at a time.  Rest and ice will also be helpful.  We are checking some labs today, including HIV and syphilis.  You will get a MyChart message or a letter if results are normal. Otherwise, you will get a call from us .  You should return to our clinic if not improving by the start of next week.  Thank you for coming to see us  at [facility] and for the opportunity to care for you! Shaneice Barsanti, MD 11/30/2023, 2:24 PM

## 2023-12-01 ENCOUNTER — Ambulatory Visit: Payer: Self-pay | Admitting: Family Medicine

## 2023-12-01 LAB — HIV ANTIBODY (ROUTINE TESTING W REFLEX): HIV Screen 4th Generation wRfx: NONREACTIVE

## 2023-12-01 LAB — RPR: RPR Ser Ql: NONREACTIVE

## 2024-01-04 ENCOUNTER — Other Ambulatory Visit: Payer: Self-pay | Admitting: Family Medicine

## 2024-01-29 ENCOUNTER — Telehealth: Payer: Self-pay

## 2024-01-29 NOTE — Telephone Encounter (Signed)
 Mother returns call to nurse line. She reports that patient was at nail salon on Saturday and had brief episode where she passed out.   Mother states that around Lor[facility] Years patient had chipped her tooth and had been afraid to eat, as she did not want to make anything worse with her tooth.   Patient did not hit her head or have any injury. She was sitting in chair when she briefly passed out.   Patient began to feel better after she drank juice and ate candy provided at salon.   She is back to normal now and is currently working with no symptoms.   Due to patient's history, they would like to go ahead and schedule follow up.   Appt scheduled with PCP for tomorrow morning.   Chiquita JAYSON English, RN

## 2024-01-29 NOTE — Telephone Encounter (Signed)
 Patient's mother left VM on nurse line requesting returned call to schedule appointment for syncopal episode this past Saturday.   She reports that this has happened before, patient was evaluated in the ED and told that she was having extra heart beats.   She would like for her to receive follow up evaluation and be referred back to cardiology.   Attempted to return call to number provided.   Mother did not answer, LVM requesting that she call back to office.   Chiquita JAYSON English, RN

## 2024-01-30 ENCOUNTER — Ambulatory Visit: Admitting: Family Medicine

## 2024-01-30 ENCOUNTER — Encounter: Payer: Self-pay | Admitting: Family Medicine

## 2024-01-30 VITALS — BP 116/77 | HR 94 | Ht 64.0 in | Wt 145.2 lb

## 2024-01-30 DIAGNOSIS — R55 Syncope and collapse: Secondary | ICD-10-CM | POA: Diagnosis not present

## 2024-01-30 DIAGNOSIS — F419 Anxiety disorder, unspecified: Secondary | ICD-10-CM

## 2024-01-30 NOTE — Patient Instructions (Signed)
 It was great to see you again today.  For information on therapists, please go to [email or web address]. You can also contact your insurance company to find an in-network therapist.   Checking labs to evaluate for causes of passing out. Follow up with dentist as scheduled.  Follow up here if passing out recurs or if you have any other new concerns.  Be well, Dr. Donah

## 2024-01-30 NOTE — Progress Notes (Signed)
"  °  Date of Visit: 01/30/2024   SUBJECTIVE:   HPI:  Discussed the use of AI scribe software for clinical note transcription with the patient, who gave verbal consent to proceed.  History of Present Illness Hannah Page is a 29 year old female who presents with episode of syncope and anxiety.  Syncope and loss of consciousness - Experienced an episode of syncope on the morning of January 3rd at a nail salon. - Felt like she was about to pass out, a sensation she has experienced previously but usually manages to control. - Briefly lost consciousness and regained it quickly, felt better after consuming candy and soda. - Mother observed facial pallor during the episode. - Attributes the episode to low blood sugar, as symptoms improved after consuming sugary items. - Had not eaten much that day due to anxiety about a cracked tooth. - History of a seizure in fifth grade and a previous fainting episode in high school. - Usually recognizes prodromal symptoms and takes measures to prevent fainting.  Anxiety symptoms - Increased anxiety since late last year. - Symptoms include overthinking, heart racing, sweaty palms, and hyperventilation. - Uncertain if episodes are panic attacks. - No prior diagnosis or treatment for anxiety or other psychiatric illness - Mother has anxiety. - No feelings of depression or thoughts of self-harm or harm to others.  Dental concerns - Cracked tooth discovered on January 1st. - Anxious about potential infection related to cracked tooth. - Reduced food intake due to anxiety about dental issue. - Dentist appointment scheduled for today.  Concerns regarding blood glucose and diabetes - Mother concerned about possibility of diabetes. - Does not believe she has diabetes, as previous blood work was negative. - Acknowledges it has been some time since last blood work.   OBJECTIVE:   BP 116/77   Pulse 94   Ht 5' 4 (1.626 m)   Wt 145  lb 3.2 oz (65.9 kg)   LMP 01/12/2024   SpO2 99%   BMI 24.92 kg/m  Gen: no acute distress, pleasant, cooperative HEENT: normocephalic, atraumatic, + crackled R lower molar without signs of infection Heart: regular rate and rhythm, no murmur Lungs: clear to auscultation bilaterally, normal work of breathing  Neuro: alert, speech normal, grossly nonfocal Ext: No appreciable lower extremity edema bilaterally  Psych: normal range of affect, well groomed, speech normal in rate and volume, normal eye contact   ASSESSMENT/PLAN:   Assessment & Plan Syncope, unspecified syncope type Suspect was possibly combination of vasovagal from anxiety, compounded by reduced PO intake in setting of anxiety over tooth fracture. Feeling better now. Examination normal. EKG unchanged from prior, no concerning signs to suggest etiology of syncope. Orthostatic vital signs done in office today and are negative. - check labs: CBC, CMET, A1c, TSH - discussed possibility of echo; patient will let me know if she decides she wants it after discussing with her mother Anxiety Increased anxiety symptoms with no prior treatment. Family history present. Prefers therapy before medication. - Advised using [email or web address] to find a therapist who accepts her insurance - Discussed potential for medication if therapy is ineffective or symptoms worsen. - check labs as above    Amel Kitch J. Donah, MD [facility] "

## 2024-01-31 ENCOUNTER — Ambulatory Visit: Payer: Self-pay | Admitting: Family Medicine

## 2024-01-31 LAB — CMP14+EGFR
ALT: 10 IU/L (ref 0–32)
AST: 11 IU/L (ref 0–40)
Albumin: 4.8 g/dL (ref 4.0–5.0)
Alkaline Phosphatase: 58 IU/L (ref 41–116)
BUN/Creatinine Ratio: 13 (ref 9–23)
BUN: 10 mg/dL (ref 6–20)
Bilirubin Total: 0.8 mg/dL (ref 0.0–1.2)
CO2: 23 mmol/L (ref 20–29)
Calcium: 10.2 mg/dL (ref 8.7–10.2)
Chloride: 103 mmol/L (ref 96–106)
Creatinine, Ser: 0.77 mg/dL (ref 0.57–1.00)
Globulin, Total: 2.4 g/dL (ref 1.5–4.5)
Glucose: 81 mg/dL (ref 70–99)
Potassium: 4.2 mmol/L (ref 3.5–5.2)
Sodium: 142 mmol/L (ref 134–144)
Total Protein: 7.2 g/dL (ref 6.0–8.5)
eGFR: 109 mL/min/1.73

## 2024-01-31 LAB — CBC WITH DIFFERENTIAL/PLATELET
Basophils Absolute: 0 x10E3/uL (ref 0.0–0.2)
Basos: 1 %
EOS (ABSOLUTE): 0 x10E3/uL (ref 0.0–0.4)
Eos: 0 %
Hematocrit: 44.1 % (ref 34.0–46.6)
Hemoglobin: 14.4 g/dL (ref 11.1–15.9)
Immature Grans (Abs): 0 x10E3/uL (ref 0.0–0.1)
Immature Granulocytes: 0 %
Lymphocytes Absolute: 1.3 x10E3/uL (ref 0.7–3.1)
Lymphs: 21 %
MCH: 29.9 pg (ref 26.6–33.0)
MCHC: 32.7 g/dL (ref 31.5–35.7)
MCV: 92 fL (ref 79–97)
Monocytes Absolute: 0.5 x10E3/uL (ref 0.1–0.9)
Monocytes: 8 %
Neutrophils Absolute: 4.2 x10E3/uL (ref 1.4–7.0)
Neutrophils: 70 %
Platelets: 317 x10E3/uL (ref 150–450)
RBC: 4.81 x10E6/uL (ref 3.77–5.28)
RDW: 11.8 % (ref 11.7–15.4)
WBC: 6.1 x10E3/uL (ref 3.4–10.8)

## 2024-01-31 LAB — TSH RFX ON ABNORMAL TO FREE T4: TSH: 0.547 u[IU]/mL (ref 0.450–4.500)

## 2024-01-31 LAB — HEMOGLOBIN A1C
Est. average glucose Bld gHb Est-mCnc: 103 mg/dL
Hgb A1c MFr Bld: 5.2 % (ref 4.8–5.6)

## 2024-02-01 ENCOUNTER — Ambulatory Visit (HOSPITAL_COMMUNITY)
Admission: RE | Admit: 2024-02-01 | Discharge: 2024-02-01 | Disposition: A | Discharge status: Home / Self Care | Source: Ambulatory Visit | Attending: Cardiovascular Disease | Admitting: Cardiovascular Disease

## 2024-02-01 DIAGNOSIS — F419 Anxiety disorder, unspecified: Secondary | ICD-10-CM | POA: Insufficient documentation

## 2024-02-01 DIAGNOSIS — R55 Syncope and collapse: Secondary | ICD-10-CM | POA: Diagnosis present

## 2024-02-01 LAB — ECHOCARDIOGRAM COMPLETE
Area-P 1/2: 5.14 cm2
S' Lateral: 2.5 cm

## 2024-02-01 NOTE — Assessment & Plan Note (Signed)
 Increased anxiety symptoms with no prior treatment. Family history present. Prefers therapy before medication. - Advised using [email or web address] to find a therapist who accepts her insurance - Discussed potential for medication if therapy is ineffective or symptoms worsen. - check labs as above

## 2024-02-07 ENCOUNTER — Ambulatory Visit: Payer: Self-pay | Admitting: Family Medicine
